# Patient Record
Sex: Male | Born: 1995 | Race: White | Hispanic: No | Marital: Single | Smoking: Current every day smoker
Health system: Southern US, Community
[De-identification: ages and names within clinical notes are randomized; demographics above are authoritative.]

## PROBLEM LIST (undated history)

## (undated) DIAGNOSIS — E119 Type 2 diabetes mellitus without complications: Secondary | ICD-10-CM

## (undated) DIAGNOSIS — I10 Essential (primary) hypertension: Secondary | ICD-10-CM

## (undated) HISTORY — PX: OTHER SURGICAL HISTORY: SHX169

---

## 2021-06-30 ENCOUNTER — Emergency Department
Admission: EM | Admit: 2021-06-30 | Discharge: 2021-06-30 | Disposition: A | Discharge status: Home / Self Care | Payer: No Typology Code available for payment source | Attending: Student in an Organized Health Care Education/Training Program | Admitting: Student in an Organized Health Care Education/Training Program

## 2021-06-30 ENCOUNTER — Other Ambulatory Visit: Payer: Self-pay

## 2021-06-30 ENCOUNTER — Ambulatory Visit: Payer: Self-pay

## 2021-06-30 ENCOUNTER — Encounter: Payer: Self-pay | Admitting: Emergency Medicine

## 2021-06-30 ENCOUNTER — Emergency Department: Payer: No Typology Code available for payment source

## 2021-06-30 DIAGNOSIS — M542 Cervicalgia: Secondary | ICD-10-CM | POA: Insufficient documentation

## 2021-06-30 DIAGNOSIS — I10 Essential (primary) hypertension: Secondary | ICD-10-CM | POA: Diagnosis not present

## 2021-06-30 DIAGNOSIS — E119 Type 2 diabetes mellitus without complications: Secondary | ICD-10-CM | POA: Diagnosis not present

## 2021-06-30 DIAGNOSIS — S060X0A Concussion without loss of consciousness, initial encounter: Secondary | ICD-10-CM | POA: Diagnosis not present

## 2021-06-30 DIAGNOSIS — Y92410 Unspecified street and highway as the place of occurrence of the external cause: Secondary | ICD-10-CM | POA: Diagnosis not present

## 2021-06-30 DIAGNOSIS — S0990XA Unspecified injury of head, initial encounter: Secondary | ICD-10-CM | POA: Diagnosis present

## 2021-06-30 HISTORY — DX: Essential (primary) hypertension: I10

## 2021-06-30 HISTORY — DX: Type 2 diabetes mellitus without complications: E11.9

## 2021-06-30 MED ORDER — ACETAMINOPHEN 500 MG PO TABS
1000.0000 mg | ORAL_TABLET | Freq: Once | ORAL | Status: AC
  Administered 2021-06-30: 1000 mg via ORAL
  Filled 2021-06-30: qty 2

## 2021-06-30 MED ORDER — METHOCARBAMOL 500 MG PO TABS: 500.0000 mg | ORAL_TABLET | Freq: Four times a day (QID) | ORAL | 0 refills | Status: AC

## 2021-06-30 MED ORDER — METHOCARBAMOL 500 MG PO TABS
500.0000 mg | ORAL_TABLET | Freq: Once | ORAL | Status: AC
  Administered 2021-06-30: 500 mg via ORAL
  Filled 2021-06-30: qty 1

## 2021-06-30 MED ORDER — IBUPROFEN 600 MG PO TABS
600.0000 mg | ORAL_TABLET | Freq: Once | ORAL | Status: AC
  Administered 2021-06-30: 600 mg via ORAL
  Filled 2021-06-30: qty 1
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
## 2021-06-30 NOTE — Discharge Instructions (Addendum)
Please treat your concussion by resting, avoiding triggers for your headaches. You may take Tylenol, up to 1000mg  4x daily as well as Ibuprofen up to 600mg  daily for your headache and neck pain. You have also been prescribed Robaxin, a muscle relaxant to take for your neck up to 4x daily as needed. Please do not drive or operate heavy machinery with this medication, as it possibly could make you drowsy. Return to the ER with any worsening symptoms, otherwise follow up with primary care or urgent care as needed.
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
## 2023-11-09 ENCOUNTER — Other Ambulatory Visit: Payer: Self-pay

## 2023-11-09 ENCOUNTER — Encounter
Admission: EM | Disposition: A | Discharge status: Other Acute Inpatient Hospital | Payer: Self-pay | Source: Home / Self Care | Attending: Family Medicine

## 2023-11-09 ENCOUNTER — Encounter: Payer: Self-pay | Admitting: Emergency Medicine

## 2023-11-09 ENCOUNTER — Emergency Department: Payer: Medicaid Other

## 2023-11-09 ENCOUNTER — Inpatient Hospital Stay: Payer: Medicaid Other | Admitting: Certified Registered"

## 2023-11-09 ENCOUNTER — Inpatient Hospital Stay
Admission: EM | Admit: 2023-11-09 | Discharge: 2023-11-10 | DRG: 854 | Disposition: A | Discharge status: Other Acute Inpatient Hospital | Payer: Medicaid Other | Attending: Internal Medicine | Admitting: Internal Medicine

## 2023-11-09 ENCOUNTER — Inpatient Hospital Stay: Payer: Medicaid Other

## 2023-11-09 DIAGNOSIS — F1290 Cannabis use, unspecified, uncomplicated: Secondary | ICD-10-CM | POA: Diagnosis present

## 2023-11-09 DIAGNOSIS — T383X6A Underdosing of insulin and oral hypoglycemic [antidiabetic] drugs, initial encounter: Secondary | ICD-10-CM | POA: Diagnosis present

## 2023-11-09 DIAGNOSIS — N493 Fournier gangrene: Secondary | ICD-10-CM | POA: Diagnosis present

## 2023-11-09 DIAGNOSIS — E1152 Type 2 diabetes mellitus with diabetic peripheral angiopathy with gangrene: Secondary | ICD-10-CM | POA: Diagnosis present

## 2023-11-09 DIAGNOSIS — R4189 Other symptoms and signs involving cognitive functions and awareness: Secondary | ICD-10-CM | POA: Diagnosis present

## 2023-11-09 DIAGNOSIS — I10 Essential (primary) hypertension: Secondary | ICD-10-CM | POA: Diagnosis present

## 2023-11-09 DIAGNOSIS — E1165 Type 2 diabetes mellitus with hyperglycemia: Secondary | ICD-10-CM | POA: Diagnosis present

## 2023-11-09 DIAGNOSIS — R739 Hyperglycemia, unspecified: Secondary | ICD-10-CM

## 2023-11-09 DIAGNOSIS — E871 Hypo-osmolality and hyponatremia: Secondary | ICD-10-CM | POA: Diagnosis present

## 2023-11-09 DIAGNOSIS — F17210 Nicotine dependence, cigarettes, uncomplicated: Secondary | ICD-10-CM | POA: Diagnosis present

## 2023-11-09 DIAGNOSIS — Z91199 Patient's noncompliance with other medical treatment and regimen due to unspecified reason: Secondary | ICD-10-CM

## 2023-11-09 DIAGNOSIS — A419 Sepsis, unspecified organism: Principal | ICD-10-CM | POA: Diagnosis present

## 2023-11-09 DIAGNOSIS — Z720 Tobacco use: Secondary | ICD-10-CM | POA: Insufficient documentation

## 2023-11-09 DIAGNOSIS — Z716 Tobacco abuse counseling: Secondary | ICD-10-CM

## 2023-11-09 DIAGNOSIS — Z91148 Patient's other noncompliance with medication regimen for other reason: Secondary | ICD-10-CM | POA: Diagnosis not present

## 2023-11-09 HISTORY — PX: SCROTAL EXPLORATION: SHX2386

## 2023-11-09 HISTORY — PX: WOUND DEBRIDEMENT: SHX247

## 2023-11-09 LAB — URINALYSIS, ROUTINE W REFLEX MICROSCOPIC
Bilirubin Urine: NEGATIVE
Glucose, UA: >=500 mg/dL — AB
Ketones, ur: 20 mg/dL — AB
Leukocytes,Ua: NEGATIVE
Nitrite: NEGATIVE
Protein, ur: 30 mg/dL — AB
Specific Gravity, Urine: 1.03 (ref 1.005–1.030)
Squamous Epithelial / HPF: 0 /[HPF] (ref 0–5)
pH: 5 (ref 5.0–8.0)

## 2023-11-09 LAB — COMPREHENSIVE METABOLIC PANEL
ALT: 28 U/L (ref 0–44)
AST: 19 U/L (ref 15–41)
Albumin: 3.8 g/dL (ref 3.5–5.0)
Alkaline Phosphatase: 101 U/L (ref 38–126)
Anion gap: 14 (ref 5–15)
BUN: 18 mg/dL (ref 6–20)
CO2: 23 mmol/L (ref 22–32)
Calcium: 9 mg/dL (ref 8.9–10.3)
Chloride: 90 mmol/L — ABNORMAL LOW (ref 98–111)
Creatinine, Ser: 1.05 mg/dL (ref 0.61–1.24)
GFR, Estimated: >60 mL/min (ref >60)
Glucose, Bld: 434 mg/dL — ABNORMAL HIGH (ref 70–99)
Potassium: 4 mmol/L (ref 3.5–5.1)
Sodium: 127 mmol/L — ABNORMAL LOW (ref 135–145)
Total Bilirubin: 1.7 mg/dL — ABNORMAL HIGH (ref <1.2)
Total Protein: 8.1 g/dL (ref 6.5–8.1)

## 2023-11-09 LAB — CBC WITH DIFFERENTIAL/PLATELET
Abs Immature Granulocytes: 0.26 10*3/uL — ABNORMAL HIGH (ref 0.00–0.07)
Basophils Absolute: 0.1 10*3/uL (ref 0.0–0.1)
Basophils Relative: 0 %
Eosinophils Absolute: 0 10*3/uL (ref 0.0–0.5)
Eosinophils Relative: 0 %
HCT: 40.2 % (ref 39.0–52.0)
Hemoglobin: 14 g/dL (ref 13.0–17.0)
Immature Granulocytes: 1 %
Lymphocytes Relative: 9 %
Lymphs Abs: 1.8 10*3/uL (ref 0.7–4.0)
MCH: 28.3 pg (ref 26.0–34.0)
MCHC: 34.8 g/dL (ref 30.0–36.0)
MCV: 81.2 fL (ref 80.0–100.0)
Monocytes Absolute: 1 10*3/uL (ref 0.1–1.0)
Monocytes Relative: 5 %
Neutro Abs: 18.3 10*3/uL — ABNORMAL HIGH (ref 1.7–7.7)
Neutrophils Relative %: 85 %
Platelets: 279 10*3/uL (ref 150–400)
RBC: 4.95 MIL/uL (ref 4.22–5.81)
RDW: 11.8 % (ref 11.5–15.5)
WBC: 21.4 10*3/uL — ABNORMAL HIGH (ref 4.0–10.5)
nRBC: 0 % (ref 0.0–0.2)

## 2023-11-09 LAB — CBG MONITORING, ED
Glucose-Capillary: 270 mg/dL — ABNORMAL HIGH (ref 70–99)
Glucose-Capillary: 222 mg/dL — ABNORMAL HIGH (ref 70–99)
Glucose-Capillary: 220 mg/dL — ABNORMAL HIGH (ref 70–99)

## 2023-11-09 LAB — CBC
HCT: 30.7 % — ABNORMAL LOW (ref 39.0–52.0)
Hemoglobin: 10.6 g/dL — ABNORMAL LOW (ref 13.0–17.0)
MCH: 28.6 pg (ref 26.0–34.0)
MCHC: 34.5 g/dL (ref 30.0–36.0)
MCV: 82.7 fL (ref 80.0–100.0)
Platelets: 199 10*3/uL (ref 150–400)
RBC: 3.71 MIL/uL — ABNORMAL LOW (ref 4.22–5.81)
RDW: 12.1 % (ref 11.5–15.5)
WBC: 14.7 10*3/uL — ABNORMAL HIGH (ref 4.0–10.5)
nRBC: 0 % (ref 0.0–0.2)

## 2023-11-09 LAB — CHLAMYDIA/NGC RT PCR (ARMC ONLY)
Chlamydia Tr: NOT DETECTED
N gonorrhoeae: NOT DETECTED

## 2023-11-09 LAB — CREATININE, SERUM
Creatinine, Ser: 0.84 mg/dL (ref 0.61–1.24)
GFR, Estimated: >60 mL/min (ref >60)

## 2023-11-09 LAB — LACTIC ACID, PLASMA
Lactic Acid, Venous: 3.1 mmol/L (ref 0.5–1.9)
Lactic Acid, Venous: 1.8 mmol/L (ref 0.5–1.9)

## 2023-11-09 LAB — GLUCOSE, CAPILLARY: Glucose-Capillary: 247 mg/dL — ABNORMAL HIGH (ref 70–99)

## 2023-11-09 LAB — BETA-HYDROXYBUTYRIC ACID: Beta-Hydroxybutyric Acid: 0.45 mmol/L — ABNORMAL HIGH (ref 0.05–0.27)

## 2023-11-09 SURGERY — EXPLORATION, SCROTUM
Anesthesia: General | Site: Scrotum | Laterality: Bilateral

## 2023-11-09 MED ORDER — SUGAMMADEX SODIUM 200 MG/2ML IV SOLN
INTRAVENOUS | Status: DC | PRN
  Administered 2023-11-09: 200 mg via INTRAVENOUS

## 2023-11-09 MED ORDER — ENOXAPARIN SODIUM 40 MG/0.4ML IJ SOSY
40.0000 mg | PREFILLED_SYRINGE | INTRAMUSCULAR | Status: DC
  Administered 2023-11-09: 40 mg via SUBCUTANEOUS
  Filled 2023-11-09: qty 0.4

## 2023-11-09 MED ORDER — SODIUM CHLORIDE 0.9 % IV SOLN: INTRAVENOUS | Status: DC | PRN

## 2023-11-09 MED ORDER — MIDAZOLAM HCL 2 MG/2ML IJ SOLN
INTRAMUSCULAR | Status: AC
  Filled 2023-11-09: qty 2

## 2023-11-09 MED ORDER — SODIUM CHLORIDE 0.9 % IV SOLN
2.0000 g | Freq: Once | INTRAVENOUS | Status: AC
Start: 2023-11-09 — End: 2023-11-09
  Administered 2023-11-09: 2 g via INTRAVENOUS
  Filled 2023-11-09: qty 20

## 2023-11-09 MED ORDER — CLINDAMYCIN PHOSPHATE 600 MG/50ML IV SOLN
600.0000 mg | Freq: Three times a day (TID) | INTRAVENOUS | Status: DC
  Filled 2023-11-09: qty 50

## 2023-11-09 MED ORDER — LIDOCAINE HCL (CARDIAC) PF 100 MG/5ML IV SOSY
PREFILLED_SYRINGE | INTRAVENOUS | Status: DC | PRN
  Administered 2023-11-09: 100 mg via INTRAVENOUS

## 2023-11-09 MED ORDER — FENTANYL CITRATE (PF) 100 MCG/2ML IJ SOLN: 25.0000 ug | INTRAMUSCULAR | Status: DC | PRN

## 2023-11-09 MED ORDER — ONDANSETRON HCL 4 MG/2ML IJ SOLN: 4.0000 mg | Freq: Once | INTRAMUSCULAR | Status: DC | PRN

## 2023-11-09 MED ORDER — ONDANSETRON HCL 4 MG/2ML IJ SOLN
4.0000 mg | Freq: Four times a day (QID) | INTRAMUSCULAR | Status: DC | PRN
  Administered 2023-11-10 (×2): 4 mg via INTRAVENOUS
  Filled 2023-11-09 (×2): qty 2

## 2023-11-09 MED ORDER — LINEZOLID 600 MG/300ML IV SOLN
600.0000 mg | Freq: Two times a day (BID) | INTRAVENOUS | Status: DC
  Administered 2023-11-09 – 2023-11-10 (×3): 600 mg via INTRAVENOUS
  Filled 2023-11-09 (×4): qty 300

## 2023-11-09 MED ORDER — IOHEXOL 300 MG/ML  SOLN
100.0000 mL | Freq: Once | INTRAMUSCULAR | Status: AC | PRN
Start: 2023-11-09 — End: 2023-11-09
  Administered 2023-11-09: 100 mL via INTRAVENOUS

## 2023-11-09 MED ORDER — ACETAMINOPHEN 10 MG/ML IV SOLN
1000.0000 mg | Freq: Once | INTRAVENOUS | Status: DC | PRN
  Administered 2023-11-09: 1000 mg via INTRAVENOUS

## 2023-11-09 MED ORDER — INSULIN ASPART 100 UNIT/ML IJ SOLN
4.0000 [IU] | Freq: Three times a day (TID) | INTRAMUSCULAR | Status: DC
  Administered 2023-11-10 (×2): 4 [IU] via SUBCUTANEOUS
  Filled 2023-11-09 (×2): qty 1

## 2023-11-09 MED ORDER — INSULIN ASPART 100 UNIT/ML IJ SOLN
0.0000 [IU] | Freq: Every day | INTRAMUSCULAR | Status: DC
  Administered 2023-11-09: 2 [IU] via SUBCUTANEOUS
  Filled 2023-11-09: qty 1

## 2023-11-09 MED ORDER — SODIUM CHLORIDE 0.9 % IV BOLUS
1000.0000 mL | Freq: Once | INTRAVENOUS | Status: AC
Start: 2023-11-09 — End: 2023-11-09
  Administered 2023-11-09: 1000 mL via INTRAVENOUS

## 2023-11-09 MED ORDER — ROCURONIUM BROMIDE 100 MG/10ML IV SOLN
INTRAVENOUS | Status: DC | PRN
  Administered 2023-11-09: 20 mg via INTRAVENOUS
  Administered 2023-11-09: 10 mg via INTRAVENOUS
  Administered 2023-11-09: 20 mg via INTRAVENOUS

## 2023-11-09 MED ORDER — PROPOFOL 10 MG/ML IV BOLUS
INTRAVENOUS | Status: DC | PRN
  Administered 2023-11-09: 150 mg via INTRAVENOUS

## 2023-11-09 MED ORDER — LACTATED RINGERS IV SOLN: INTRAVENOUS | Status: DC

## 2023-11-09 MED ORDER — ACETAMINOPHEN 10 MG/ML IV SOLN
INTRAVENOUS | Status: AC
  Filled 2023-11-09: qty 100

## 2023-11-09 MED ORDER — 0.9 % SODIUM CHLORIDE (POUR BTL) OPTIME
TOPICAL | Status: DC | PRN
  Administered 2023-11-09: 500 mL

## 2023-11-09 MED ORDER — INSULIN ASPART 100 UNIT/ML IJ SOLN
0.0000 [IU] | Freq: Three times a day (TID) | INTRAMUSCULAR | Status: DC
  Administered 2023-11-10: 3 [IU] via SUBCUTANEOUS
  Administered 2023-11-10: 11 [IU] via SUBCUTANEOUS
  Filled 2023-11-09 (×2): qty 1

## 2023-11-09 MED ORDER — ONDANSETRON HCL 4 MG/2ML IJ SOLN
INTRAMUSCULAR | Status: DC | PRN
  Administered 2023-11-09 (×2): 4 mg via INTRAVENOUS

## 2023-11-09 MED ORDER — PIPERACILLIN-TAZOBACTAM 3.375 G IVPB
3.3750 g | Freq: Three times a day (TID) | INTRAVENOUS | Status: DC
  Administered 2023-11-09 – 2023-11-10 (×4): 3.375 g via INTRAVENOUS
  Filled 2023-11-09 (×3): qty 50

## 2023-11-09 MED ORDER — OXYCODONE HCL 5 MG PO TABS
5.0000 mg | ORAL_TABLET | Freq: Once | ORAL | Status: AC | PRN
  Administered 2023-11-09: 5 mg via ORAL

## 2023-11-09 MED ORDER — MIDAZOLAM HCL 2 MG/2ML IJ SOLN
INTRAMUSCULAR | Status: DC | PRN
  Administered 2023-11-09: 2 mg via INTRAVENOUS

## 2023-11-09 MED ORDER — FENTANYL CITRATE (PF) 100 MCG/2ML IJ SOLN
INTRAMUSCULAR | Status: DC | PRN
  Administered 2023-11-09 (×2): 50 ug via INTRAVENOUS
  Administered 2023-11-09: 100 ug via INTRAVENOUS

## 2023-11-09 MED ORDER — PROPOFOL 10 MG/ML IV BOLUS
INTRAVENOUS | Status: AC
  Filled 2023-11-09: qty 20

## 2023-11-09 MED ORDER — FENTANYL CITRATE (PF) 100 MCG/2ML IJ SOLN
INTRAMUSCULAR | Status: AC
  Filled 2023-11-09: qty 2

## 2023-11-09 MED ORDER — IRRISEPT - 450ML BOTTLE WITH 0.05% CHG IN STERILE WATER, USP 99.95% OPTIME
TOPICAL | Status: DC | PRN
  Administered 2023-11-09: 450 mL

## 2023-11-09 MED ORDER — DEXMEDETOMIDINE HCL IN NACL 80 MCG/20ML IV SOLN
INTRAVENOUS | Status: DC | PRN
  Administered 2023-11-09 (×3): 4 ug via INTRAVENOUS
  Administered 2023-11-09: 8 ug via INTRAVENOUS

## 2023-11-09 MED ORDER — HYDROCODONE-ACETAMINOPHEN 5-325 MG PO TABS
1.0000 | ORAL_TABLET | Freq: Once | ORAL | Status: AC
  Administered 2023-11-09: 1 via ORAL
  Filled 2023-11-09: qty 1

## 2023-11-09 MED ORDER — INSULIN ASPART 100 UNIT/ML IJ SOLN
10.0000 [IU] | Freq: Once | INTRAMUSCULAR | Status: AC
Start: 2023-11-09 — End: 2023-11-09
  Administered 2023-11-09: 10 [IU] via INTRAVENOUS
  Filled 2023-11-09: qty 1

## 2023-11-09 MED ORDER — SUCCINYLCHOLINE CHLORIDE 200 MG/10ML IV SOSY
PREFILLED_SYRINGE | INTRAVENOUS | Status: DC | PRN
  Administered 2023-11-09: 100 mg via INTRAVENOUS

## 2023-11-09 MED ORDER — MORPHINE SULFATE (PF) 4 MG/ML IV SOLN
4.0000 mg | INTRAVENOUS | Status: DC | PRN
Start: 2023-11-09 — End: 2023-11-10
  Administered 2023-11-10: 4 mg via INTRAVENOUS
  Filled 2023-11-09 (×2): qty 1

## 2023-11-09 MED ORDER — INSULIN ASPART 100 UNIT/ML IJ SOLN
5.0000 [IU] | Freq: Once | INTRAMUSCULAR | Status: AC
  Administered 2023-11-09: 5 [IU] via SUBCUTANEOUS

## 2023-11-09 MED ORDER — INSULIN ASPART 100 UNIT/ML IJ SOLN
INTRAMUSCULAR | Status: AC
  Filled 2023-11-09: qty 1

## 2023-11-09 MED ORDER — SUCCINYLCHOLINE CHLORIDE 200 MG/10ML IV SOSY
PREFILLED_SYRINGE | INTRAVENOUS | Status: AC
  Filled 2023-11-09: qty 10

## 2023-11-09 MED ORDER — VANCOMYCIN HCL IN DEXTROSE 1-5 GM/200ML-% IV SOLN
1000.0000 mg | Freq: Once | INTRAVENOUS | Status: AC
Start: 2023-11-09 — End: 2023-11-09
  Administered 2023-11-09: 1000 mg via INTRAVENOUS
  Filled 2023-11-09: qty 200

## 2023-11-09 MED ORDER — LIDOCAINE HCL (PF) 2 % IJ SOLN
INTRAMUSCULAR | Status: AC
  Filled 2023-11-09: qty 5

## 2023-11-09 MED ORDER — SODIUM CHLORIDE 0.9 % IV BOLUS (SEPSIS)
1000.0000 mL | Freq: Once | INTRAVENOUS | Status: AC
  Administered 2023-11-09: 1000 mL via INTRAVENOUS

## 2023-11-09 MED ORDER — ONDANSETRON HCL 4 MG/2ML IJ SOLN
4.0000 mg | Freq: Once | INTRAMUSCULAR | Status: AC
  Administered 2023-11-09: 4 mg via INTRAVENOUS
  Filled 2023-11-09: qty 2

## 2023-11-09 MED ORDER — OXYCODONE HCL 5 MG/5ML PO SOLN: 5.0000 mg | Freq: Once | ORAL | Status: AC | PRN

## 2023-11-09 MED ORDER — ONDANSETRON HCL 4 MG PO TABS: 4.0000 mg | ORAL_TABLET | Freq: Four times a day (QID) | ORAL | Status: DC | PRN

## 2023-11-09 MED ORDER — KETOROLAC TROMETHAMINE 30 MG/ML IJ SOLN
INTRAMUSCULAR | Status: DC | PRN
  Administered 2023-11-09: 30 mg via INTRAVENOUS

## 2023-11-09 MED ORDER — OXYCODONE HCL 5 MG PO TABS
ORAL_TABLET | ORAL | Status: AC
  Filled 2023-11-09: qty 1

## 2023-11-09 MED ORDER — PIPERACILLIN-TAZOBACTAM 3.375 G IVPB
INTRAVENOUS | Status: AC
  Filled 2023-11-09: qty 50

## 2023-11-09 MED ORDER — SODIUM CHLORIDE 0.9 % IV BOLUS (SEPSIS)
1000.0000 mL | Freq: Once | INTRAVENOUS | Status: AC
Start: 2023-11-09 — End: 2023-11-09
  Administered 2023-11-09: 1000 mL via INTRAVENOUS

## 2023-11-09 MED ORDER — NICOTINE 21 MG/24HR TD PT24
21.0000 mg | MEDICATED_PATCH | Freq: Every day | TRANSDERMAL | Status: DC
  Filled 2023-11-09: qty 1

## 2023-11-09 SURGICAL SUPPLY — 37 items
BLADE SURG 15 STRL LF DISP TIS (BLADE) ×2 IMPLANT
BNDG GAUZE DERMACEA FLUFF 4 (GAUZE/BANDAGES/DRESSINGS) IMPLANT
CNTNR URN SCR LID CUP LEK RST (MISCELLANEOUS) IMPLANT
DRAIN PENROSE 12X.25 LTX STRL (MISCELLANEOUS) ×2 IMPLANT
DRAPE LAPAROTOMY 100X77 ABD (DRAPES) ×2 IMPLANT
DRSG GAUZE FLUFF 36X18 (GAUZE/BANDAGES/DRESSINGS) ×2 IMPLANT
DRSG TEGADERM 4X4.75 (GAUZE/BANDAGES/DRESSINGS) ×2 IMPLANT
DRSG TELFA 3X4 N-ADH STERILE (GAUZE/BANDAGES/DRESSINGS) ×2 IMPLANT
ELECT REM PT RETURN 9FT ADLT (ELECTROSURGICAL) ×2
ELECTRODE REM PT RTRN 9FT ADLT (ELECTROSURGICAL) ×2 IMPLANT
GAUZE 4X4 16PLY ~~LOC~~+RFID DBL (SPONGE) ×2 IMPLANT
GLOVE BIO SURGEON STRL SZ 6.5 (GLOVE) ×2 IMPLANT
GLOVE INDICATOR 7.0 STRL GRN (GLOVE) ×2 IMPLANT
GOWN STRL REUS W/ TWL LRG LVL3 (GOWN DISPOSABLE) ×4 IMPLANT
JET LAVAGE IRRISEPT WOUND (IRRIGATION / IRRIGATOR) ×2
KIT TURNOVER KIT A (KITS) ×2 IMPLANT
LABEL OR SOLS (LABEL) ×2 IMPLANT
LAVAGE JET IRRISEPT WOUND (IRRIGATION / IRRIGATOR) IMPLANT
MANIFOLD NEPTUNE II (INSTRUMENTS) ×2 IMPLANT
NS IRRIG 500ML POUR BTL (IV SOLUTION) ×2 IMPLANT
PACK BASIN MINOR ARMC (MISCELLANEOUS) ×2 IMPLANT
PAD ABD DERMACEA PRESS 5X9 (GAUZE/BANDAGES/DRESSINGS) IMPLANT
SOL PREP PVP 2OZ (MISCELLANEOUS) ×2
SOLUTION PREP PVP 2OZ (MISCELLANEOUS) ×2 IMPLANT
SPONGE KITTNER 5P (MISCELLANEOUS) ×2 IMPLANT
SPONGE T-LAP 18X18 ~~LOC~~+RFID (SPONGE) IMPLANT
STRIP CLOSURE SKIN 1/2X4 (GAUZE/BANDAGES/DRESSINGS) ×2 IMPLANT
SUPPORT SCROTAL LRG NO STRP (SOFTGOODS) ×2 IMPLANT
SUT CHROMIC 3 0 SH 27 (SUTURE) ×2 IMPLANT
SUT CHROMIC 4 0 SH 27 (SUTURE) ×2 IMPLANT
SUT CHROMIC BR 1/2CLE 2-0 54IN (SUTURE) ×2 IMPLANT
SUT PLAIN 3 0 SH 27IN (SUTURE) ×2 IMPLANT
SUT SILK 2 0 SH (SUTURE) ×2 IMPLANT
SUT VIC AB 4-0 PS2 18 (SUTURE) ×2 IMPLANT
TRAP FLUID SMOKE EVACUATOR (MISCELLANEOUS) ×2 IMPLANT
TRAY FOLEY SLVR 16FR LF STAT (SET/KITS/TRAYS/PACK) IMPLANT
WATER STERILE IRR 500ML POUR (IV SOLUTION) ×2 IMPLANT

## 2023-11-09 NOTE — Op Note (Addendum)
Date of procedure: 11/09/23  Preoperative diagnosis:  Fournier's gangrene  Postoperative diagnosis:  Same as above  Procedure: Scrotal exploration with surgical debridement Partial perineal debridement  Surgeon: Vanna Scotland, MD  Anesthesia: General  Complications: None  Intraoperative findings: See pictures below.  Extensive necrotic tissue involving primarily the left hemiscrotum, base of scrotum and entirety of the perineum.  General surgery consulted intraoperatively due to the extent of the perineal involvement.  Final wound size approximately 19 cm x 12 cm.  EBL: 20 cc  Specimens: Necrotic scrotal/perineal tissue for wound culture and pathology  Drains: 16 French Foley catheter  Indication: Grant Curry is a 27 y.o. patient with uncontrolled diabetes presenting with clinical Fournier's gangrene.  A stat CT was ordered for surgical planning purposes and he was taken to the operating room shortly thereafter.  After reviewing the management options for treatment, he elected to proceed with the above surgical procedure(s). We have discussed the potential benefits and risks of the procedure, side effects of the proposed treatment, the likelihood of the patient achieving the goals of the procedure, and any potential problems that might occur during the procedure or recuperation. Informed consent has been obtained.  Consent was also obtained from his parents who help him in his decision making.  They were both present at the bedside in the preop holding area.  Description of procedure:  The patient was taken to the operating room and general anesthesia was induced.  The patient was placed in the dorsal lithotomy position, prepped and draped in the usual sterile fashion, and preoperative antibiotics were administered. A preoperative time-out was performed.   A 16 French Foley catheter was placed sterilely on the field.  This was to help with urinary drainage but as well as urethral  identification.  There was a expanding Clydene Pugh which appeared to have progressed since in the ER measuring at least 3 x 3 cm.  The initially made an incision circumferentially around the eschar and upon doing so, it became clear that this was a fairly extensive and very necrotic tissue deep to this layer.  The tissue was gray, gelatinous, extremely malodorous with seeping dirty dishwater quality fluid.  I ended up extending the incision vertically and off to the left opening the scrotal tissues and debriding along the way.  Ultimately, necrotic tissue was identified laterally to the left almost to the level of the superior scrotum adjacent to the base of the penis.  It involved sheets of necrotic dark toes tissue all of which was excised.  Ended up having to excise a fair amount of skin to aid in exposure.  I dissected the left side all the way down to the tunica vaginalis of the left testicle which did not overtly appear to be involved.  I bluntly dissected around this testicle to break up any other tissue which appeared necrotic until the extent of the debridement appeared to be satisfactory.  Then extended the incision down into the perineum and this tissue was surprisingly just is necrotic and more involved than anticipated based on CT scan and exam.  I ended up excising a large strip of tissue about two thirds way down the perineum out laterally towards the inguinal folds.  A day progressed inferiorly, called Dr. Tonna Boehringer to come assist to help to further debride the perineum and assess the perirectal area.  Please see his notes for further details.  With all the debridement was completed, a large defect remained.  The wound bed appeared to be  mostly viable and the skin edges were healthy and bleeding.  Careful and adequate hemostasis was achieved.  Dimensions and pictures of the wound as above and below.  The wound was copiously irrigated using diluted chlorhexidine solution.  Finally, Curlex and ABD pads along  with mesh panties were applied.  The patient was then awakened and transferred to the PACU in stable condition.  Plan: Continue supportive care and broad-spectrum antibiotics.  Follow-up wound culture.  Lengthy discussion with the patient's family today about further debridement plans as well as involvement of plastics down the road.  We discussed the overall course for this condition as well as its very serious nature.  Will plan to return tomorrow around noon for dressing change under anesthesia and possible further debridement as deemed necessary.  N.p.o. at midnight.  Vanna Scotland, M.D.

## 2023-11-09 NOTE — H&P (Signed)
History and Physical    Patient: Grant Curry ZOX:[id number] DOB: [birth year] DOA: 11/09/2023 DOS: the patient was seen and examined on 11/09/2023 PCP: Pcp, No  Patient coming from: Home  Chief Complaint:  Chief Complaint  Patient presents with   Groin Pain   HPI: Grant Curry is a 27 y.o. male with medical history significant of tobacco abuse, uncontrolled type 2 diabetes, hypertension presenting with sepsis, Fournier's gangrene, hyperglycemia.  Patient reports worsening left groin pain over the past week or so.  Positive generalized malaise and chills.  No chest pain or shortness of breath.  Baseline type 2 diabetes.  Does not take medications or check sugar on a regular basis.  Also 1 pack/day smoker.  Denies any prior episodes like this in the past.  Positive lower abdominal pain.  No focal hemiparesis or confusion.  Baseline mild cognitive deficit. Presented to the ER afebrile, heart rate 100s, BP stable.  Satting well on room air.  White count 21.4, hemoglobin 14, platelets 279, creatinine 1.05, glucose 434, CO2 23.  T. bili 1.7.  Lactate 3.1-1.8.  Scrotal ultrasound with diffuse asymmetric thickening along left scrotal wall.  CT abdomen pelvis with contrast pending. Review of Systems: As mentioned in the history of present illness. All other systems reviewed and are negative. Past Medical History:  Diagnosis Date   Diabetes mellitus without complication (HCC)    Hypertension    History reviewed. No pertinent surgical history. Social History:  reports that he has never smoked. He has never used smokeless tobacco. He reports that he does not drink alcohol and does not use drugs.  No Known Allergies  History reviewed. No pertinent family history.  Prior to Admission medications   Not on File    Physical Exam: Vitals:   11/09/23 1227 11/09/23 1230 11/09/23 1300 11/09/23 1424  BP: (!) 141/87 (!) 142/86 135/80 135/80  Pulse:  96 98 98  Resp:    16  Temp:    98.8 F (37.1 C)   TempSrc:    Oral  SpO2:    97%  Weight:      Height:       Physical Exam Constitutional:      Appearance: He is normal weight.  HENT:     Head: Normocephalic and atraumatic.     Nose: Nose normal.     Mouth/Throat:     Mouth: Mucous membranes are moist.  Eyes:     Pupils: Pupils are equal, round, and reactive to light.  Cardiovascular:     Rate and Rhythm: Normal rate and regular rhythm.  Pulmonary:     Effort: Pulmonary effort is normal.  Abdominal:     General: Bowel sounds are normal.  Genitourinary:    Comments: See picture  Musculoskeletal:        General: Normal range of motion.  Skin:    Comments: See picture    Neurological:     General: No focal deficit present.     Data Reviewed:  There are no new results to review at this time.  US SCROTUM W/DOPPLER CLINICAL DATA:  [id number] Swelling of left half of scrotum [id number]  EXAM: SCROTAL ULTRASOUND  DOPPLER ULTRASOUND OF THE TESTICLES  TECHNIQUE: Complete ultrasound examination of the testicles, epididymis, and other scrotal structures was performed. Color and spectral Doppler ultrasound were also utilized to evaluate blood flow to the testicles.  COMPARISON:  None Available.  FINDINGS: Right testicle  Measurements: 2.0 x 2.9 x 4.8 cm. No mass or microlithiasis visualized.  Left testicle  Measurements: 2.0 x 2.8 x 4.4 cm. No mass or microlithiasis visualized.  Right epididymis:  Normal in size and appearance.  Left epididymis:  Normal in size and appearance.  Hydrocele:  None visualized.  Varicocele:  None visualized.  Pulsed Doppler interrogation of both testes demonstrates normal low resistance arterial and venous waveforms bilaterally.  There is diffuse asymmetric thickening along the left scrotal wall, as seen on the provided images. There are heterogeneous hypoechoic areas; however, no discrete drainable abscess or collection.  IMPRESSION: *There is diffuse asymmetric thickening  along the left scrotal wall, as seen on the provided images. There are heterogeneous hypoechoic areas; however, no discrete drainable abscess or collection. Findings may represent subcutaneous edema. Correlate clinically. *Otherwise unremarkable exam.  Electronically Signed   By: Jules Schick M.D.   On: 11/09/2023 15:21  Lab Results  Component Value Date   WBC 21.4 (H) 11/09/2023   HGB 14.0 11/09/2023   HCT 40.2 11/09/2023   MCV 81.2 11/09/2023   PLT 279 11/09/2023   Last metabolic panel Lab Results  Component Value Date   GLUCOSE 434 (H) 11/09/2023   NA 127 (L) 11/09/2023   K 4.0 11/09/2023   CL 90 (L) 11/09/2023   CO2 23 11/09/2023   BUN 18 11/09/2023   CREATININE 1.05 11/09/2023   GFRNONAA >60 11/09/2023   CALCIUM 9.0 11/09/2023   PROT 8.1 11/09/2023   ALBUMIN 3.8 11/09/2023   BILITOT 1.7 (H) 11/09/2023   ALKPHOS 101 11/09/2023   AST 19 11/09/2023   ALT 28 11/09/2023   ANIONGAP 14 11/09/2023    Assessment and Plan: * Fournier gangrene Marked L sided scrotal redness, swelling and pain  RFs include poorly controlled DM, tobacco abuse  Dr. Apolinar Junes w/ urology evaluated patient at the bedside- plan for operative evaluation today  Pending CT A&P to better assess  IV zosyn, clindamycin and vancomycin for infectious coverage per urology recommendations  Pan culture  Follow    Sepsis [facility]) Meeting sepsis criteria w/ HR 100s, WBC 21  Lactate 3.1  Noted genitourinary source w/ fournier's gangrene  Panculture  IV zosyn, clindamycin and vancomycin for infectious coverage per urology recommendations  LR MIVF  Trend lactate  Follow closely   Tobacco abuse 1 PPD smoker  Discussed cessation at length  Nicotine patch  Follow    Hyperglycemia Blood sugar in 400s on presentation w/o overt DKA  Baseline poorly controlled DM- not on medication  SSI  A1C  Discussed importance of appropriate blood sugar management Diabetic education  Follow        Advance  Care Planning:   Code Status: Not on file   Consults: Urology   Family Communication: No family at the bedside   Severity of Illness: The appropriate patient status for this patient is INPATIENT. Inpatient status is judged to be reasonable and necessary in order to provide the required intensity of service to ensure the patient's safety. The patient's presenting symptoms, physical exam findings, and initial radiographic and laboratory data in the context of their chronic comorbidities is felt to place them at high risk for further clinical deterioration. Furthermore, it is not anticipated that the patient will be medically stable for discharge from the hospital within 2 midnights of admission.   * I certify that at the point of admission it is my clinical judgment that the patient will require inpatient hospital care spanning beyond 2 midnights from the point of admission due to high intensity of service, high risk for  further deterioration and high frequency of surveillance required.*  Author: Floydene Flock, MD 11/09/2023 3:18 PM  For on call review [email or web address].

## 2023-11-09 NOTE — ED Notes (Signed)
Pt unaware if he has ever had a hernia, states that he "windmilled" his left leg getting out of bed before thanksgiving, states then on thanksgiving in the shower he noticed swelling to the left testicle and states that it has gotten bigger

## 2023-11-09 NOTE — Sepsis Progress Note (Signed)
eLink is following this Code Sepsis. °

## 2023-11-09 NOTE — Anesthesia Procedure Notes (Signed)
Procedure Name: Intubation Date/Time: 11/09/2023 4:43 PM  Performed by: Morene Crocker, CRNAPre-anesthesia Checklist: Patient identified, Emergency Drugs available, Suction available and Patient being monitored Patient Re-evaluated:Patient Re-evaluated prior to induction Oxygen Delivery Method: Circle system utilized Preoxygenation: Pre-oxygenation with 100% oxygen Induction Type: IV induction, Rapid sequence and Cricoid Pressure applied Laryngoscope Size: McGrath and 4 Grade View: Grade I Tube type: Oral Tube size: 7.0 mm Number of attempts: 1 Airway Equipment and Method: Stylet Placement Confirmation: ETT inserted through vocal cords under direct vision, positive ETCO2 and breath sounds checked- equal and bilateral Secured at: 21 cm Tube secured with: Tape Dental Injury: Teeth and Oropharynx as per pre-operative assessment

## 2023-11-09 NOTE — Transfer of Care (Signed)
Immediate Anesthesia Transfer of Care Note  Patient: Grant Curry  Procedure(s) Performed: Sande Brothers AND PARTIAL PERINEAL EXPLORATION AND DEBRIDEMENT FOR FOURNIER'S GANGRENE (Bilateral: Scrotum) DEBRIDEMENT PERINEAL WOUND (Scrotum)  Patient Location: PACU  Anesthesia Type:General  Level of Consciousness: drowsy  Airway & Oxygen Therapy: Patient Spontanous Breathing and Patient connected to face mask oxygen  Post-op Assessment: Report given to RN and Post -op Vital signs reviewed and stable  Post vital signs: Reviewed and stable  Last Vitals:  Vitals Value Taken Time  BP 133/70 11/09/23 1807  Temp    Pulse 107 11/09/23 1810  Resp 23 11/09/23 1810  SpO2 100 % 11/09/23 1810  Vitals shown include unfiled device data.  Last Pain:  Vitals:   11/09/23 1424  TempSrc: Oral  PainSc:          Complications: No notable events documented.

## 2023-11-09 NOTE — Assessment & Plan Note (Signed)
Marked L sided scrotal redness, swelling and pain  RFs include poorly controlled DM, tobacco abuse  Dr. Apolinar Junes w/ urology evaluated patient at the bedside- plan for operative evaluation today  Pending CT A&P to better assess  IV zosyn, clindamycin and vancomycin for infectious coverage per urology recommendations  Pan culture  Follow

## 2023-11-09 NOTE — Consult Note (Addendum)
Urology Consult  I have been asked to see the patient by Dr. Alvester Morin, for evaluation and management of Fournier's gangrene.  Chief Complaint: Scrotal pain  History of Present Illness: Grant Curry is a 27 y.o. year old diabetic male presenting to the ER for with a week of scrotal pain and swelling.  His mom reports that over Thanksgiving, he felt like he pulled something in his groin and was having some pain but no swelling.  Over the past 3 days or so, the swelling is progressed.  In the emergency room, exam was concerning for Fournier's gangrene.  He also had a significant lactate to 3.1, leukocytosis to 21.4 and markedly elevated blood sugar to 434.  He was tachycardic but improved with fluids.  He was started on broad-spectrum antibiotics in the form of ceftriaxone and vancomycin later broadened to linezolid and Zosyn after discussing with pharmacist.  Scrotal ultrasound showed scrotal wall edema.  Good blood flow in each of the testicles.  After my exam, he was sent for stat CT primarily for surgical planning purposes.  Processes appears to be confined to the scrotum, awaiting radiologic interpretation.  Patient and his family report that he was diagnosed with type 2 diabetes about 10 years ago.  Unfortunately, he refused to take metformin and has been untreated since that time.  History of scrotal pathology or abscess.  Both parents are present at bedside and aid in the decision making.  Past Medical History:  Diagnosis Date   Diabetes mellitus without complication (HCC)    Hypertension     History reviewed. No pertinent surgical history.  Home Medications:  No outpatient medications have been marked as taking for the 11/09/23 encounter [facility] Encounter).    Allergies: No Known Allergies  History reviewed. No pertinent family history.  Social History:  reports that he has never smoked. He has never used smokeless tobacco. He reports that he does not drink alcohol  and does not use drugs.  ROS: A complete review of systems was performed.  All systems are negative except for pertinent findings as noted.  Physical Exam:  Vital signs in last 24 hours: Temp:  [98.8 F (37.1 C)] 98.8 F (37.1 C) (12/04 1424) Pulse Rate:  [96-111] 98 (12/04 1424) Resp:  [16-18] 16 (12/04 1424) BP: (132-151)/(80-111) 135/80 (12/04 1424) SpO2:  [97 %-100 %] 97 % (12/04 1424) Weight:  [83.9 kg] 83.9 kg (12/04 1000) Constitutional:  Alert and oriented, No acute distress HEENT: [place] AT, moist mucus membranes.  Trachea midline, no masses Cardiovascular: Regular rate and rhythm, no clubbing, cyanosis, or edema. Respiratory: Normal respiratory effort, lungs clear bilaterally GI: Abdomen is soft, nontender, nondistended, no abdominal masses GU: Edematous slightly erythematous primarily left hemiscrotum.  The base of the scrotum, there is a black eschar with some fluctuance measuring about 2 cm x 2 cm in diameter.  The edema extends into the perineum.  There is no obvious crepitus appreciated.   No penile swelling or involvement.   Lymph: No inguinal adenopathy Neurologic: Grossly intact, no focal deficits, moving all 4 extremities Psychiatric: Normal mood and affect   Laboratory Data:  Recent Labs    11/09/23 1203  WBC 21.4*  HGB 14.0  HCT 40.2   Recent Labs    11/09/23 1203  NA 127*  K 4.0  CL 90*  CO2 23  GLUCOSE 434*  BUN 18  CREATININE 1.05  CALCIUM 9.0   No results for input(s): "LABPT", "INR" in the  last 72 hours. No results for input(s): "LABURIN" in the last 72 hours. Results for orders placed or performed during the hospital encounter of 11/09/23  Chlamydia/NGC rt PCR ([facility] only)     Status: None   Collection Time: 11/09/23 10:01 AM   Specimen: Urine; GU  Result Value Ref Range Status   Specimen source GC/Chlam URINE, RANDOM  Final   Chlamydia Tr NOT DETECTED NOT DETECTED Final   N gonorrhoeae NOT DETECTED NOT DETECTED Final    Comment:  (NOTE) This CT/NG assay has not been evaluated in patients with a history of  hysterectomy. Performed at [facility], [address] [place], [place] [address]      Radiologic Imaging: Both the CT scan and scrotal ultrasound personally reviewed.  No obvious fluid collections or gas on CT.  There is thickening of the left hemiscrotum.  Testicles appear viable on ultrasound.  No retroperitoneal or any other disease outside of the scrotum appreciated.  Impression/ Plan:  1.  Fournier's gangrene-there is necrotic/dead tissue at the base of the scrotum requiring debridement.  Stat CT scan prior to surgical intervention was personally reviewed.  Does not appear to be any involvement with the perirectal space or superior to the scrotum.  He has been started on broad-spectrum antibiotics.  In discussion with the pharmacist, recommendations to broaden his antibiotics initially started on ceftriaxone and vancomycin to linezolid and Zosyn.  Will plan to send tissue cultures.  Had a frank and honest conversation with the family today.  He may require further debridement down the road and possible reconstruction depending on the degree of involvement once the necrotic tissue has been excised.  Additional risk including bleeding and pain also discussed.  All questions answered.  Care was coordinated with pharmacy and admitting hospitalist, Dr. Alvester Morin who was agreed to admit the patient.  2.  Uncontrolled diabetes-contributing factor to #1.  Treatment per hospitalist service.  11/09/2023, 3:13 PM  Vanna Scotland,  MD

## 2023-11-09 NOTE — ED Notes (Signed)
Assumed care of patient at 1100. Received report from [facility].

## 2023-11-09 NOTE — ED Triage Notes (Signed)
Pt via POV from home. Pt c/o groin pain and swelling since Thursday. Denies urinary symptoms. Denies any penile discharge. Pt is A&Ox4 and NAD

## 2023-11-09 NOTE — Assessment & Plan Note (Signed)
Meeting sepsis criteria w/ HR 100s, WBC 21  Lactate 3.1  Noted genitourinary source w/ fournier's gangrene  Panculture  IV zosyn, clindamycin and vancomycin for infectious coverage per urology recommendations  LR MIVF  Trend lactate  Follow closely

## 2023-11-09 NOTE — Consult Note (Signed)
Pharmacy Antibiotic Note  Grant Curry is a 27 y.o. male admitted on 11/09/2023 with Fournier's gangrene. Patient reports left scrotal enlargement that began approximately 6 days ago. Ultrasound of the scrotum shows diffuse asymmetric thickening along the left scrotal wall with no discrete drainable abscess or collection. Pharmacy has been consulted for Zosyn dosing.  Today, 11/09/2023 Day 1 of antibiotics  WBC 21.4 Afebrile  Scr 1.05 today with estimated CrCl 115 mL/min   Plan: Start Zosyn 3.375 gm IV Q8H based on current renal function  Patient also on linezolid 600 mg IV Q12H Pharmacy will continue to monitor and dose adjust app  Height: 6' (182.9 cm) Weight: 83.9 kg (185 lb) IBW/kg (Calculated) : 77.6  Temp (24hrs), Avg:98.6 F (37 C), Min:98.3 F (36.8 C), Max:98.8 F (37.1 C)  Recent Labs  Lab 11/09/23 1203 11/09/23 1220 11/09/23 1405  WBC 21.4*  --   --   CREATININE 1.05  --   --   LATICACIDVEN  --  3.1* 1.8    Estimated Creatinine Clearance: 115 mL/min (by C-G formula based on SCr of 1.05 mg/dL).    No Known Allergies  Antimicrobials this admission: Ceftriaxone 12/4 x 1 Vancomycin 12/4 x 1  Zosyn 12/4 >>  Linezolid 12/4 >>   Dose adjustments this admission:  Microbiology results: 12/4 BCx (one set): sent  Thank you for allowing pharmacy to be a part of this patient's care.  Littie Deeds, PharmD Pharmacy Resident  11/09/2023 3:36 PM

## 2023-11-09 NOTE — Op Note (Signed)
Pre-Op Dx: Fournier's gangrene Post-Op Dx: Same Anesthesia: GETA EBL: 5 mL Complications:  none apparent Specimen: None Procedure: Perineal wound debridement Surgeon: Tonna Boehringer  Indications for procedure: Intraoperative consult by urology for Fournier's gangrene initially starting in the scrotal area.  IntraOp noted to extend towards the perineum and gluteus region.  Urology contacted surgery consult for further assessment.  Description of Procedure:  Procedure taken over from urology.  Examination of the already open wound involving the scrotum noted brown dirty dishwater fluid extending from the posterior aspect of the scrotal wound extending towards the right gluteus.  Overlying skin was incised and more necrotic tissue noted within the deep dermal layer with associated purulent drainage.  The tissue was debrided and the incision extended approximately 4 cm more towards the right gluteus until no further necrotic tissue noted.  No additional purulent drainage was noted either.  Reexamination of the wound within the perineal area noted additional necrotic tissue on the left aspect of the wound as well.  This all removed.  All Necrotic tissue removed using electrocautery and involve the deep dermal layer.  After confirming no additional purulent and dark brown discharge from the perineal area and no visible necrotic tissue, procedure was passed off to urology.  Please see their op note for further details.

## 2023-11-09 NOTE — ED Provider Notes (Signed)
[facility] Provider Note    Event Date/Time   First MD Initiated Contact with Patient 11/09/23 1012     (approximate)   History   Groin Pain   HPI  Grant Curry is a 27 y.o. male   is brought to the ED by parents with complaint of left scrotal enlargement that began approximately 6 days ago.  Patient denies any urinary symptoms, fever, chills, nausea or vomiting.  He denies any penile discharge.  No trauma.  Patient has a history of hypertension and diabetes.  He currently does not have a PCP and family states that he generally goes to an urgent care rarely as he normally does not have any medical problems.  Father states that he has not taken any medication for diabetes because he refuses and has probably been 10 years since any medications have been taken.  At that time he was on metformin.  Patient admits to smoking 1 pack cigarettes per day, occasionally drinks alcohol and smokes marijuana on a daily basis.      Physical Exam   Triage Vital Signs: ED Triage Vitals [11/09/23 1000]  Encounter Vitals Group     BP 132/87     Systolic BP Percentile      Diastolic BP Percentile      Pulse Rate (!) 111     Resp 18     Temp 98.8 F (37.1 C)     Temp Source Oral     SpO2 100 %     Weight 185 lb (83.9 kg)     Height 6' (1.829 m)     Head Circumference      Peak Flow      Pain Score 5     Pain Loc      Pain Education      Exclude from Growth Chart     Most recent vital signs: Vitals:   11/09/23 1300 11/09/23 1424  BP: 135/80 135/80  Pulse: 98 98  Resp:  16  Temp:  98.8 F (37.1 C)  SpO2:  97%     General: Awake, no distress.  Able to answer simple questions, cooperative, appears mentally delayed. CV:  Good peripheral perfusion.  Heart regular rate rhythm. Resp:  Normal effort.  Lungs are clear bilaterally. Abd:  No distention.  Other:  Examination of the genitals there is no penile discharge however there is marked edema to the left  scrotum and markedly tender to palpation.  Second exam after pain medications been given shows a ecchymotic/purplish area posteriorly measuring approximately 2 cm suggestive of Fournier's gangrene.   ED Results / Procedures / Treatments   Labs (all labs ordered are listed, but only abnormal results are displayed) Labs Reviewed  URINALYSIS, ROUTINE W REFLEX MICROSCOPIC - Abnormal; Notable for the following components:      Result Value   Color, Urine YELLOW (*)    APPearance CLEAR (*)    Glucose, UA >=500 (*)    Hgb urine dipstick SMALL (*)    Ketones, ur 20 (*)    Protein, ur 30 (*)    Bacteria, UA RARE (*)    All other components within normal limits  CBC WITH DIFFERENTIAL/PLATELET - Abnormal; Notable for the following components:   WBC 21.4 (*)    Neutro Abs 18.3 (*)    Abs Immature Granulocytes 0.26 (*)    All other components within normal limits  COMPREHENSIVE METABOLIC PANEL - Abnormal; Notable for the following components:  Sodium 127 (*)    Chloride 90 (*)    Glucose, Bld 434 (*)    Total Bilirubin 1.7 (*)    All other components within normal limits  LACTIC ACID, PLASMA - Abnormal; Notable for the following components:   Lactic Acid, Venous 3.1 (*)    All other components within normal limits  CHLAMYDIA/NGC RT PCR ([facility] ONLY)            CULTURE, BLOOD (SINGLE)  LACTIC ACID, PLASMA  HEMOGLOBIN A1C  CBG MONITORING, ED      RADIOLOGY  US Scrotum with Doppler radiology report pending  CT abdomen pelvis with contrast ordered.   PROCEDURES:  Critical Care performed:   Procedures   MEDICATIONS ORDERED IN ED: Medications  sodium chloride 0.9 % bolus 1,000 mL (1,000 mLs Intravenous New Bag/Given 11/09/23 1457)    And  sodium chloride 0.9 % bolus 1,000 mL (1,000 mLs Intravenous New Bag/Given 11/09/23 1501)  vancomycin (VANCOCIN) IVPB 1000 mg/200 mL premix (1,000 mg Intravenous New Bag/Given 11/09/23 1419)  insulin aspart (novoLOG) injection 0-15 Units (has no  administration in time range)  insulin aspart (novoLOG) injection 0-5 Units (has no administration in time range)  insulin aspart (novoLOG) injection 4 Units (has no administration in time range)  iohexol (OMNIPAQUE) 300 MG/ML solution 100 mL (has no administration in time range)  clindamycin (CLEOCIN) IVPB 600 mg (has no administration in time range)  HYDROcodone-acetaminophen (NORCO/VICODIN) 5-325 MG per tablet 1 tablet (1 tablet Oral Given 11/09/23 1224)  ondansetron (ZOFRAN) injection 4 mg (4 mg Intravenous Given 11/09/23 1226)  insulin aspart (novoLOG) injection 10 Units (10 Units Intravenous Given 11/09/23 1314)  sodium chloride 0.9 % bolus 1,000 mL (1,000 mLs Intravenous New Bag/Given 11/09/23 1459)  cefTRIAXone (ROCEPHIN) 2 g in sodium chloride 0.9 % 100 mL IVPB (0 g Intravenous Stopped 11/09/23 1457)     IMPRESSION / MDM / ASSESSMENT AND PLAN / ED COURSE  I reviewed the triage vital signs and the nursing notes.   Differential diagnosis includes, but is not limited to, scrotal abscess, left epididymitis, left orchitis, Fournier's gangrene considered, diabetes with patient being medically noncompliant.  ----------------------------------------- 2:15 PM on 11/09/2023 ----------------------------------------- Dr. Roxan Hockey is in to see patient and started sepsis protocol.    Consulted Dr. Vanna Scotland who is on-call for urology who recommends a CT abdomen/pelvis with contrast stat.  Consult for hospitalist for admission. Father was made aware of concerns.    ----------------------------------------- 2:52 PM on 11/09/2023 ----------------------------------------- Spoke with Dr. Alvester Morin, hospitalist at [facility].  He will be coming to see the patient.  Discussed patient's condition and lab findings and plans for admission.      Patient's presentation is most consistent with acute presentation with potential threat to life or bodily function.  FINAL CLINICAL IMPRESSION(S) / ED DIAGNOSES    Final diagnoses:  Fournier's gangrene of scrotum  Hyperglycemia due to diabetes mellitus (HCC)  Medically noncompliant  Hyponatremia     Rx / DC Orders   ED Discharge Orders     None        Note:  This document was prepared using Dragon voice recognition software and may include unintentional dictation errors.   Tommi Rumps, PA-C 11/09/23 1516    Willy Eddy, MD 11/10/23 [phone]

## 2023-11-09 NOTE — Assessment & Plan Note (Signed)
Blood sugar in 400s on presentation w/o overt DKA  Baseline poorly controlled DM- not on medication  SSI  A1C  Discussed importance of appropriate blood sugar management Diabetic education  Follow

## 2023-11-09 NOTE — Consult Note (Signed)
ED Pharmacy Antibiotic Sign Off An antibiotic consult was received from an ED provider for vancomycin per pharmacy dosing for cellulitis. A chart review was completed to assess appropriateness.   The following one time order(s) were placed: Vancomycin 1 gm   Further antibiotic and/or antibiotic pharmacy consults should be ordered by the admitting provider if indicated.   Thank you for allowing pharmacy to be a part of this patient's care.   Littie Deeds, PharmD Pharmacy Resident  11/09/2023 2:04 PM

## 2023-11-09 NOTE — Consult Note (Signed)
CODE SEPSIS - PHARMACY COMMUNICATION  **Broad Spectrum Antibiotics should be administered within 1 hour of Sepsis diagnosis**  Time Code Sepsis Called/Page Received: 1353   Antibiotics Ordered: Vancomycin & Ceftriaxone   Time of 1st antibiotic administration: 1419  Additional action taken by pharmacy: N/A  If necessary, Name of Provider/Nurse Contacted: N/A  Littie Deeds, PharmD Pharmacy Resident  11/09/2023 2:03 PM

## 2023-11-09 NOTE — Anesthesia Preprocedure Evaluation (Signed)
Anesthesia Evaluation  Patient identified by MRN, date of birth, ID band Patient awake    Reviewed: Allergy & Precautions, NPO status , Patient's Chart, lab work & pertinent test results  History of Anesthesia Complications Negative for: history of anesthetic complications  Airway Mallampati: I   Neck ROM: Full    Dental  (+) Poor Dentition   Pulmonary Current Smoker (1 ppd)Patient did not abstain from smoking.   Pulmonary exam normal breath sounds clear to auscultation       Cardiovascular hypertension, Normal cardiovascular exam Rhythm:Regular Rate:Normal     Neuro/Psych negative neurological ROS     GI/Hepatic negative GI ROS,,,  Endo/Other  diabetes, Type 2    Renal/GU negative Renal ROS     Musculoskeletal   Abdominal   Peds  Hematology negative hematology ROS (+)   Anesthesia Other Findings   Reproductive/Obstetrics                             Anesthesia Physical Anesthesia Plan  ASA: 2 and emergent  Anesthesia Plan: General   Post-op Pain Management:    Induction: Intravenous and Rapid sequence  PONV Risk Score and Plan: 1 and Ondansetron, Dexamethasone and Treatment may vary due to age or medical condition  Airway Management Planned: Oral ETT  Additional Equipment:   Intra-op Plan:   Post-operative Plan: Extubation in OR  Informed Consent: I have reviewed the patients History and Physical, chart, labs and discussed the procedure including the risks, benefits and alternatives for the proposed anesthesia with the patient or authorized representative who has indicated his/her understanding and acceptance.     Dental advisory given  Plan Discussed with: CRNA  Anesthesia Plan Comments: (Parents at beside for history and consent. Patient consented for risks of anesthesia including but not limited to:  - adverse reactions to medications - damage to eyes, teeth, lips  or other oral mucosa - nerve damage due to positioning  - sore throat or hoarseness - damage to heart, brain, nerves, lungs, other parts of body or loss of life  Informed patient about role of CRNA in peri- and intra-operative care.  Patient voiced understanding.)       Anesthesia Quick Evaluation

## 2023-11-09 NOTE — Assessment & Plan Note (Signed)
1 PPD smoker  Discussed cessation at length  Nicotine patch  Follow

## 2023-11-10 ENCOUNTER — Inpatient Hospital Stay: Payer: Medicaid Other | Admitting: Certified Registered"

## 2023-11-10 ENCOUNTER — Encounter (HOSPITAL_COMMUNITY): Payer: Self-pay

## 2023-11-10 ENCOUNTER — Encounter
Admission: EM | Disposition: A | Discharge status: Other Acute Inpatient Hospital | Payer: Self-pay | Source: Home / Self Care | Attending: Family Medicine

## 2023-11-10 ENCOUNTER — Encounter: Payer: Self-pay | Admitting: Family Medicine

## 2023-11-10 ENCOUNTER — Inpatient Hospital Stay (HOSPITAL_COMMUNITY)
Admission: EM | Admit: 2023-11-10 | Discharge: 2023-11-15 | DRG: 854 | Disposition: A | Discharge status: Home Health | Payer: Medicaid Other | Source: Other Acute Inpatient Hospital | Attending: Family Medicine | Admitting: Family Medicine

## 2023-11-10 DIAGNOSIS — E871 Hypo-osmolality and hyponatremia: Secondary | ICD-10-CM | POA: Diagnosis not present

## 2023-11-10 DIAGNOSIS — N493 Fournier gangrene: Principal | ICD-10-CM | POA: Diagnosis present

## 2023-11-10 DIAGNOSIS — E1152 Type 2 diabetes mellitus with diabetic peripheral angiopathy with gangrene: Secondary | ICD-10-CM | POA: Diagnosis present

## 2023-11-10 DIAGNOSIS — E1165 Type 2 diabetes mellitus with hyperglycemia: Secondary | ICD-10-CM | POA: Diagnosis present

## 2023-11-10 DIAGNOSIS — B9620 Unspecified Escherichia coli [E. coli] as the cause of diseases classified elsewhere: Secondary | ICD-10-CM | POA: Diagnosis present

## 2023-11-10 DIAGNOSIS — A419 Sepsis, unspecified organism: Secondary | ICD-10-CM | POA: Diagnosis present

## 2023-11-10 DIAGNOSIS — I10 Essential (primary) hypertension: Secondary | ICD-10-CM | POA: Diagnosis present

## 2023-11-10 DIAGNOSIS — E1169 Type 2 diabetes mellitus with other specified complication: Secondary | ICD-10-CM

## 2023-11-10 DIAGNOSIS — Z794 Long term (current) use of insulin: Secondary | ICD-10-CM

## 2023-11-10 DIAGNOSIS — F17210 Nicotine dependence, cigarettes, uncomplicated: Secondary | ICD-10-CM | POA: Diagnosis present

## 2023-11-10 HISTORY — PX: SCROTAL EXPLORATION: SHX2386

## 2023-11-10 LAB — COMPREHENSIVE METABOLIC PANEL
ALT: 25 U/L (ref 0–44)
AST: 18 U/L (ref 15–41)
Albumin: 2.7 g/dL — ABNORMAL LOW (ref 3.5–5.0)
Alkaline Phosphatase: 70 U/L (ref 38–126)
Anion gap: 11 (ref 5–15)
BUN: 17 mg/dL (ref 6–20)
CO2: 23 mmol/L (ref 22–32)
Calcium: 7.5 mg/dL — ABNORMAL LOW (ref 8.9–10.3)
Chloride: 101 mmol/L (ref 98–111)
Creatinine, Ser: 1.11 mg/dL (ref 0.61–1.24)
GFR, Estimated: >60 mL/min (ref >60)
Glucose, Bld: 210 mg/dL — ABNORMAL HIGH (ref 70–99)
Potassium: 3.9 mmol/L (ref 3.5–5.1)
Sodium: 135 mmol/L (ref 135–145)
Total Bilirubin: 1.1 mg/dL (ref <1.2)
Total Protein: 5.9 g/dL — ABNORMAL LOW (ref 6.5–8.1)

## 2023-11-10 LAB — HEMOGLOBIN A1C
Hgb A1c MFr Bld: 10.6 % — ABNORMAL HIGH (ref 4.8–5.6)
Mean Plasma Glucose: 257.52 mg/dL

## 2023-11-10 LAB — CBC
HCT: 33.9 % — ABNORMAL LOW (ref 39.0–52.0)
HCT: 34.3 % — ABNORMAL LOW (ref 39.0–52.0)
Hemoglobin: 11.5 g/dL — ABNORMAL LOW (ref 13.0–17.0)
Hemoglobin: 11.7 g/dL — ABNORMAL LOW (ref 13.0–17.0)
MCH: 28.5 pg (ref 26.0–34.0)
MCH: 28.5 pg (ref 26.0–34.0)
MCHC: 33.9 g/dL (ref 30.0–36.0)
MCHC: 34.1 g/dL (ref 30.0–36.0)
MCV: 84.1 fL (ref 80.0–100.0)
MCV: 83.5 fL (ref 80.0–100.0)
Platelets: 215 10*3/uL (ref 150–400)
Platelets: 254 10*3/uL (ref 150–400)
RBC: 4.03 MIL/uL — ABNORMAL LOW (ref 4.22–5.81)
RBC: 4.11 MIL/uL — ABNORMAL LOW (ref 4.22–5.81)
RDW: 12.2 % (ref 11.5–15.5)
RDW: 12.6 % (ref 11.5–15.5)
WBC: 16.5 10*3/uL — ABNORMAL HIGH (ref 4.0–10.5)
WBC: 13.9 10*3/uL — ABNORMAL HIGH (ref 4.0–10.5)
nRBC: 0 % (ref 0.0–0.2)
nRBC: 0 % (ref 0.0–0.2)

## 2023-11-10 LAB — HIV ANTIBODY (ROUTINE TESTING W REFLEX): HIV Screen 4th Generation wRfx: NONREACTIVE

## 2023-11-10 LAB — GLUCOSE, CAPILLARY
Glucose-Capillary: 188 mg/dL — ABNORMAL HIGH (ref 70–99)
Glucose-Capillary: 190 mg/dL — ABNORMAL HIGH (ref 70–99)
Glucose-Capillary: 330 mg/dL — ABNORMAL HIGH (ref 70–99)
Glucose-Capillary: 290 mg/dL — ABNORMAL HIGH (ref 70–99)

## 2023-11-10 LAB — CREATININE, SERUM
Creatinine, Ser: 1.14 mg/dL (ref 0.61–1.24)
GFR, Estimated: >60 mL/min (ref >60)

## 2023-11-10 SURGERY — EXPLORATION, SCROTUM
Anesthesia: General | Laterality: Bilateral

## 2023-11-10 MED ORDER — INSULIN GLARGINE-YFGN 100 UNIT/ML ~~LOC~~ SOLN
8.0000 [IU] | Freq: Every day | SUBCUTANEOUS | Status: DC
  Filled 2023-11-10: qty 0.08

## 2023-11-10 MED ORDER — LINEZOLID 600 MG/300ML IV SOLN: 600.0000 mg | Freq: Two times a day (BID) | INTRAVENOUS | Status: DC

## 2023-11-10 MED ORDER — MIDAZOLAM HCL 2 MG/2ML IJ SOLN
INTRAMUSCULAR | Status: DC | PRN
  Administered 2023-11-10: 2 mg via INTRAVENOUS

## 2023-11-10 MED ORDER — OXYCODONE HCL 5 MG PO TABS
5.0000 mg | ORAL_TABLET | ORAL | Status: DC | PRN
  Administered 2023-11-11 – 2023-11-15 (×10): 5 mg via ORAL
  Filled 2023-11-10 (×10): qty 1

## 2023-11-10 MED ORDER — INSULIN ASPART 100 UNIT/ML IJ SOLN
0.0000 [IU] | Freq: Three times a day (TID) | INTRAMUSCULAR | Status: DC
  Administered 2023-11-11: 2 [IU] via SUBCUTANEOUS

## 2023-11-10 MED ORDER — BUPIVACAINE HCL 0.5 % IJ SOLN
INTRAMUSCULAR | Status: DC | PRN
  Administered 2023-11-10: 20 mL

## 2023-11-10 MED ORDER — ONDANSETRON HCL 4 MG/2ML IJ SOLN
INTRAMUSCULAR | Status: AC
  Filled 2023-11-10: qty 2

## 2023-11-10 MED ORDER — PROPOFOL 10 MG/ML IV BOLUS
INTRAVENOUS | Status: AC
  Filled 2023-11-10: qty 20

## 2023-11-10 MED ORDER — LIVING WELL WITH DIABETES BOOK
Freq: Once | Status: AC
  Filled 2023-11-10: qty 1

## 2023-11-10 MED ORDER — ONDANSETRON HCL 4 MG/2ML IJ SOLN
4.0000 mg | Freq: Four times a day (QID) | INTRAMUSCULAR | Status: DC | PRN
  Administered 2023-11-13: 4 mg via INTRAVENOUS
  Filled 2023-11-10: qty 2

## 2023-11-10 MED ORDER — DEXMEDETOMIDINE HCL IN NACL 80 MCG/20ML IV SOLN
INTRAVENOUS | Status: DC | PRN
  Administered 2023-11-10 (×2): 8 ug via INTRAVENOUS
  Administered 2023-11-10: 4 ug via INTRAVENOUS

## 2023-11-10 MED ORDER — FENTANYL CITRATE (PF) 100 MCG/2ML IJ SOLN: 25.0000 ug | INTRAMUSCULAR | Status: DC | PRN

## 2023-11-10 MED ORDER — OXYCODONE HCL 5 MG/5ML PO SOLN
5.0000 mg | Freq: Once | ORAL | Status: DC | PRN
Start: 2023-11-10 — End: 2023-11-10

## 2023-11-10 MED ORDER — ACETAMINOPHEN 10 MG/ML IV SOLN
INTRAVENOUS | Status: AC
  Filled 2023-11-10: qty 100

## 2023-11-10 MED ORDER — INSULIN GLARGINE-YFGN 100 UNIT/ML ~~LOC~~ SOLN: 8.0000 [IU] | Freq: Every day | SUBCUTANEOUS | Status: DC

## 2023-11-10 MED ORDER — ONDANSETRON HCL 4 MG PO TABS: 4.0000 mg | ORAL_TABLET | Freq: Four times a day (QID) | ORAL | Status: DC | PRN

## 2023-11-10 MED ORDER — PROPOFOL 10 MG/ML IV BOLUS
INTRAVENOUS | Status: DC | PRN
  Administered 2023-11-10: 200 mg via INTRAVENOUS

## 2023-11-10 MED ORDER — HEPARIN SODIUM (PORCINE) 5000 UNIT/ML IJ SOLN
5000.0000 [IU] | Freq: Three times a day (TID) | INTRAMUSCULAR | Status: DC
  Administered 2023-11-11 – 2023-11-15 (×14): 5000 [IU] via SUBCUTANEOUS
  Filled 2023-11-10 (×14): qty 1

## 2023-11-10 MED ORDER — 0.9 % SODIUM CHLORIDE (POUR BTL) OPTIME
TOPICAL | Status: DC | PRN
  Administered 2023-11-10: 500 mL

## 2023-11-10 MED ORDER — SODIUM CHLORIDE 0.9 % IV SOLN: INTRAVENOUS | Status: DC | PRN

## 2023-11-10 MED ORDER — ACETAMINOPHEN 650 MG RE SUPP: 650.0000 mg | Freq: Four times a day (QID) | RECTAL | Status: DC | PRN

## 2023-11-10 MED ORDER — DEXAMETHASONE SODIUM PHOSPHATE 10 MG/ML IJ SOLN
INTRAMUSCULAR | Status: AC
  Filled 2023-11-10: qty 1

## 2023-11-10 MED ORDER — LIDOCAINE HCL (PF) 2 % IJ SOLN
INTRAMUSCULAR | Status: AC
  Filled 2023-11-10: qty 5

## 2023-11-10 MED ORDER — ALBUTEROL SULFATE HFA 108 (90 BASE) MCG/ACT IN AERS
INHALATION_SPRAY | RESPIRATORY_TRACT | Status: AC
  Filled 2023-11-10: qty 6.7

## 2023-11-10 MED ORDER — MIDAZOLAM HCL 2 MG/2ML IJ SOLN
INTRAMUSCULAR | Status: AC
Start: 2023-11-10 — End: ?
  Filled 2023-11-10: qty 2

## 2023-11-10 MED ORDER — CHLORHEXIDINE GLUCONATE CLOTH 2 % EX PADS
6.0000 | MEDICATED_PAD | Freq: Every day | CUTANEOUS | Status: DC
Start: 2023-11-10 — End: 2023-11-10
  Administered 2023-11-10: 6 via TOPICAL

## 2023-11-10 MED ORDER — INSULIN GLARGINE-YFGN 100 UNIT/ML ~~LOC~~ SOLN
8.0000 [IU] | Freq: Every day | SUBCUTANEOUS | Status: DC
  Filled 2023-11-10 (×2): qty 0.08

## 2023-11-10 MED ORDER — DEXAMETHASONE SODIUM PHOSPHATE 10 MG/ML IJ SOLN
INTRAMUSCULAR | Status: DC | PRN
  Administered 2023-11-10: 4 mg via INTRAVENOUS

## 2023-11-10 MED ORDER — ONDANSETRON HCL 4 MG/2ML IJ SOLN
INTRAMUSCULAR | Status: DC | PRN
  Administered 2023-11-10: 4 mg via INTRAVENOUS

## 2023-11-10 MED ORDER — BUPIVACAINE HCL (PF) 0.5 % IJ SOLN
INTRAMUSCULAR | Status: AC
  Filled 2023-11-10: qty 30

## 2023-11-10 MED ORDER — LACTATED RINGERS IV SOLN: INTRAVENOUS | Status: AC

## 2023-11-10 MED ORDER — LIDOCAINE HCL (CARDIAC) PF 100 MG/5ML IV SOSY
PREFILLED_SYRINGE | INTRAVENOUS | Status: DC | PRN
  Administered 2023-11-10: 60 mg via INTRAVENOUS

## 2023-11-10 MED ORDER — ACETAMINOPHEN 10 MG/ML IV SOLN
INTRAVENOUS | Status: DC | PRN
  Administered 2023-11-10: 1000 mg via INTRAVENOUS

## 2023-11-10 MED ORDER — FENTANYL CITRATE (PF) 100 MCG/2ML IJ SOLN
INTRAMUSCULAR | Status: AC
  Filled 2023-11-10: qty 2

## 2023-11-10 MED ORDER — ACETAMINOPHEN 10 MG/ML IV SOLN: 1000.0000 mg | Freq: Once | INTRAVENOUS | Status: DC | PRN

## 2023-11-10 MED ORDER — ALBUTEROL SULFATE (2.5 MG/3ML) 0.083% IN NEBU: 2.5000 mg | INHALATION_SOLUTION | RESPIRATORY_TRACT | Status: DC | PRN

## 2023-11-10 MED ORDER — IRRISEPT - 450ML BOTTLE WITH 0.05% CHG IN STERILE WATER, USP 99.95% OPTIME
TOPICAL | Status: DC | PRN
  Administered 2023-11-10: 450 mL

## 2023-11-10 MED ORDER — PIPERACILLIN-TAZOBACTAM 3.375 G IVPB
3.3750 g | Freq: Three times a day (TID) | INTRAVENOUS | Status: DC
  Administered 2023-11-11 – 2023-11-13 (×8): 3.375 g via INTRAVENOUS
  Filled 2023-11-10 (×7): qty 50

## 2023-11-10 MED ORDER — ALBUTEROL SULFATE HFA 108 (90 BASE) MCG/ACT IN AERS
INHALATION_SPRAY | RESPIRATORY_TRACT | Status: DC | PRN
  Administered 2023-11-10: 4 via RESPIRATORY_TRACT
  Administered 2023-11-10: 6 via RESPIRATORY_TRACT

## 2023-11-10 MED ORDER — HYDROCODONE-ACETAMINOPHEN 5-325 MG PO TABS: 1.0000 | ORAL_TABLET | Freq: Four times a day (QID) | ORAL | Status: DC | PRN

## 2023-11-10 MED ORDER — PIPERACILLIN-TAZOBACTAM 3.375 G IVPB: 3.3750 g | Freq: Three times a day (TID) | INTRAVENOUS | Status: DC

## 2023-11-10 MED ORDER — ACETAMINOPHEN 325 MG PO TABS
650.0000 mg | ORAL_TABLET | Freq: Four times a day (QID) | ORAL | Status: DC | PRN
  Administered 2023-11-11 – 2023-11-12 (×2): 650 mg via ORAL
  Filled 2023-11-10 (×2): qty 2

## 2023-11-10 MED ORDER — LINEZOLID 600 MG/300ML IV SOLN
600.0000 mg | Freq: Two times a day (BID) | INTRAVENOUS | Status: DC
  Administered 2023-11-11 – 2023-11-12 (×5): 600 mg via INTRAVENOUS
  Filled 2023-11-10 (×6): qty 300

## 2023-11-10 MED ORDER — OXYCODONE HCL 5 MG PO TABS: 5.0000 mg | ORAL_TABLET | Freq: Once | ORAL | Status: DC | PRN

## 2023-11-10 MED ORDER — FENTANYL CITRATE (PF) 100 MCG/2ML IJ SOLN
INTRAMUSCULAR | Status: DC | PRN
  Administered 2023-11-10 (×2): 50 ug via INTRAVENOUS
  Administered 2023-11-10: 4 ug via INTRAVENOUS

## 2023-11-10 SURGICAL SUPPLY — 20 items
BLADE SURG 15 STRL LF DISP TIS (BLADE) ×1 IMPLANT
BNDG STRETCH GAUZE 3IN X12FT (GAUZE/BANDAGES/DRESSINGS) IMPLANT
BRIEF MESH DISP 2XL (UNDERPADS AND DIAPERS) IMPLANT
DRAPE LAPAROTOMY 100X77 ABD (DRAPES) ×1 IMPLANT
ELECT REM PT RETURN 9FT ADLT (ELECTROSURGICAL) ×1
ELECTRODE REM PT RTRN 9FT ADLT (ELECTROSURGICAL) ×1 IMPLANT
GAUZE 4X4 16PLY ~~LOC~~+RFID DBL (SPONGE) ×1 IMPLANT
GLOVE BIO SURGEON STRL SZ 6.5 (GLOVE) ×1 IMPLANT
GLOVE INDICATOR 7.0 STRL GRN (GLOVE) ×1 IMPLANT
GOWN STRL REUS W/ TWL LRG LVL3 (GOWN DISPOSABLE) ×2 IMPLANT
JET LAVAGE IRRISEPT WOUND (IRRIGATION / IRRIGATOR) ×1
KIT TURNOVER KIT A (KITS) ×1 IMPLANT
LAVAGE JET IRRISEPT WOUND (IRRIGATION / IRRIGATOR) IMPLANT
MANIFOLD NEPTUNE II (INSTRUMENTS) ×1 IMPLANT
NS IRRIG 500ML POUR BTL (IV SOLUTION) ×1 IMPLANT
PACK BASIN MINOR ARMC (MISCELLANEOUS) ×1 IMPLANT
PAD ABD DERMACEA PRESS 5X9 (GAUZE/BANDAGES/DRESSINGS) IMPLANT
SOL PREP PVP 2OZ (MISCELLANEOUS) ×1
SOLUTION PREP PVP 2OZ (MISCELLANEOUS) ×1 IMPLANT
TRAP FLUID SMOKE EVACUATOR (MISCELLANEOUS) ×1 IMPLANT

## 2023-11-10 NOTE — Transfer of Care (Signed)
Immediate Anesthesia Transfer of Care Note  Patient: Grant Curry  Procedure(s) Performed: SCROTUM EXPLORATION (Bilateral)  Patient Location: PACU  Anesthesia Type:General  Level of Consciousness: drowsy  Airway & Oxygen Therapy: Patient Spontanous Breathing and Patient connected to face mask oxygen  Post-op Assessment: Report given to RN and Post -op Vital signs reviewed and stable  Post vital signs: Reviewed and stable  Last Vitals:  Vitals Value Taken Time  BP 108/65 11/10/23 1116  Temp    Pulse 73 11/10/23 1119  Resp 19 11/10/23 1119  SpO2 100 % 11/10/23 1119  Vitals shown include unfiled device data.  Last Pain:  Vitals:   11/10/23 1115  TempSrc:   PainSc: Asleep      Patients Stated Pain Goal: 0 (11/10/23 0953)  Complications: No notable events documented.

## 2023-11-10 NOTE — Anesthesia Procedure Notes (Signed)
Procedure Name: LMA Insertion Date/Time: 11/10/2023 10:40 AM  Performed by: Hezzie Bump, CRNAPre-anesthesia Checklist: Patient identified, Patient being monitored, Timeout performed, Emergency Drugs available and Suction available Patient Re-evaluated:Patient Re-evaluated prior to induction Oxygen Delivery Method: Circle system utilized Preoxygenation: Pre-oxygenation with 100% oxygen Induction Type: IV induction Ventilation: Mask ventilation without difficulty LMA: LMA with gastric port inserted and LMA inserted LMA Size: 4.0 Tube type: Oral Number of attempts: 1 Placement Confirmation: positive ETCO2 and breath sounds checked- equal and bilateral Tube secured with: Tape Dental Injury: Teeth and Oropharynx as per pre-operative assessment

## 2023-11-10 NOTE — Discharge Summary (Addendum)
Physician Discharge Summary   Patient: Grant Curry MRN: [id number] DOB: 09/15/1996  Admit date:     11/09/2023  Discharge date: 11/10/23  Discharge Physician: Lurene Shadow   PCP: Pcp, No   Recommendations at discharge:   Follow-up with hospitalist team and plastic surgeon at [facility]  Discharge Diagnoses: Principal Problem:   Fournier gangrene Active Problems:   Sepsis (HCC)   Hyperglycemia   Tobacco abuse  Resolved Problems:   * No resolved hospital problems. [facility] Course:  Grant Curry is a 27 y.o. male with medical history significant for hypertension, diabetes mellitus, tobacco use disorder, who presented to the hospital because of groin pain and scrotal swelling.   He was noted to have sepsis secondary to Fournier's gangrene.  He was treated with analgesia, empiric IV antibiotics and IV fluids.     Assessment and Plan: Sepsis secondary to Fournier's gangrene: S/p scrotal exploration with surgical debridement and pressure perineal debridement on 11/09/2023 and 11/10/2023.  Continue IV Zosyn and IV Zyvox.  Analgesics as needed for pain.  Case discussed with Dr. Apolinar Junes, urologist.  She recommended that patient be transferred to [facility] to be evaluated by plastic surgeon for primary closure of surgical scrotal wound.  She said she had discussed the case with Dr. Ulice Bold and Dr. Ladona Ridgel.  Please consult plastic surgeon when patient arrives at [facility]     Type II DM with severe hyperglycemia: He was started on Semglee 8 units daily, NovoLog 4 units 3 times daily with meals and NovoLog as needed for hyperglycemia.  Follow-up with diabetic coordinator. Hemoglobin A1c was 10.6. Patient said that he had taken some hypoglycemic agents in the past but he stopped taking them because "it made me sick".  He said it caused vomiting and diarrhea and he won't take anything that will make him sick. His mother said that she plans to take him to a physician  in [place] who also happens to see patient's dad.     Tobacco use disorder: Counseled to quit smoking cigarettes    He is deemed stable for discharge.  Discharge plan discussed with the patient, his mother and father at the bedside.  They are agreeable with the plan.  Initially, patient was upset about his sudden change in plans.  He said he was informed by Dr. Apolinar Junes, urologist, that he would need to see a plastic surgeon "down the line", about a week from day of surgery.  Therefore, he was surprised about the sudden change in plans to see a plastic surgeon urgently.  He is agreeable to transfer to [facility] to see a plastic surgeon for further management of scrotal wound.      Consultants: Neurologist Procedures performed: Scrotal exploration with surgical debridement and partial perineal debridement   Disposition:  [facility] Diet recommendation:  Discharge Diet Orders (From admission, onward)     Start     Ordered   11/10/23 0000  Diet Carb Modified        11/10/23 1743           Carb modified diet DISCHARGE MEDICATION: Allergies as of 11/10/2023   No Known Allergies      Medication List     TAKE these medications    insulin glargine-yfgn 100 UNIT/ML injection Commonly known as: SEMGLEE Inject 0.08 mLs (8 Units total) into the skin daily.   linezolid 600 MG/300ML IVPB Commonly known as: ZYVOX Inject 300 mLs (600 mg total) into  the vein every 12 (twelve) hours.   piperacillin-tazobactam 3.375 GM/50ML IVPB Commonly known as: ZOSYN Inject 50 mLs (3.375 g total) into the vein every 8 (eight) hours.        Follow-up Information     Grant Miner, MD. Go on 01/04/2024.   Specialty: Family Medicine Why: Please arrive at 910 am Bring a picutre ID, piece of mail with your name and address, copy of your parent's pay stub, and copay Contact information: [address] [place] [address] [phone]                 Discharge Exam: Grant Curry Weights   11/09/23 1000 11/10/23 0953  Weight: 83.9 kg 83.9 kg   GEN: NAD SKIN: Warm and dry EYES: No pallor or icterus ENT: MMM CV: RRR PULM: CTA B ABD: soft, ND, NT, +BS CNS: AAO x 3, non focal EXT: No edema or tenderness GU: Open surgical wound on the left side of the scrotum packed with gauze.  Scrotum looks erythematous and it is mildly tender.  Foley catheter in place    Condition at discharge: good  The results of significant diagnostics from this hospitalization (including imaging, microbiology, ancillary and laboratory) are listed below for reference.   Imaging Studies: CT ABDOMEN PELVIS W CONTRAST  Result Date: 11/09/2023 CLINICAL DATA:  Fournier's gangrene.  Left scrotal swelling. EXAM: CT ABDOMEN AND PELVIS WITH CONTRAST TECHNIQUE: Multidetector CT imaging of the abdomen and pelvis was performed using the standard protocol following bolus administration of intravenous contrast. RADIATION DOSE REDUCTION: This exam was performed according to the departmental dose-optimization program which includes automated exposure control, adjustment of the mA and/or kV according to patient size and/or use of iterative reconstruction technique. CONTRAST:  OMNIPAQUE IOHEXOL 300 MG/ML  SOLN COMPARISON:  Ultrasound scrotum and Doppler exam from earlier the same day. FINDINGS: Lower chest: There is a 6.3 x 8.2 mm solid noncalcified nodule in the right lung lower lobe abutting the pleura. Please see follow-up recommendations below. The lung bases are otherwise clear. No pleural effusion. The heart is normal in size. No pericardial effusion. Hepatobiliary: The liver is normal in size. Non-cirrhotic configuration. No suspicious mass. There is a geographic, small, hypoattenuating area along the falciform ligament attachment site, which even though incompletely characterized on the current examination, favored to represent focal fatty infiltration. Attention on  follow-up examination is recommended. No intrahepatic or extrahepatic bile duct dilation. Small volume dependent gallstones/sludge noted without imaging signs of acute cholecystitis. Normal gallbladder wall thickness. No pericholecystic inflammatory changes. Pancreas: Unremarkable. No pancreatic ductal dilatation or surrounding inflammatory changes. Spleen: Within normal limits. No focal lesion. Adrenals/Urinary Tract: Adrenal glands are unremarkable. No suspicious renal mass. No hydronephrosis. No renal or ureteric calculi. Unremarkable urinary bladder. Stomach/Bowel: No disproportionate dilation of the small or large bowel loops. No evidence of abnormal bowel wall thickening or inflammatory changes. The appendix is unremarkable. Vascular/Lymphatic: No ascites or pneumoperitoneum. No abdominal or pelvic lymphadenopathy, by size criteria. No aneurysmal dilation of the major abdominal arteries. Reproductive: Normal-sized prostate gland. Symmetric bilateral seminal vesicles. Along the left side of the scrotum, there is diffuse hypoattenuating area. The internal attenuation of the area is 20-22 Hounsfield units. There is no discrete peripheral wall. There is small amount of such area along the superior anterior aspect of the scrotum also, surrounding both testicles. No evidence of air within the soft tissue. Findings may represent diffuse scrotal wall edema/cellulitis without frank abscess. Other: There is a tiny fat containing  umbilical hernia. The soft tissues and abdominal wall are otherwise unremarkable. Musculoskeletal: No suspicious osseous lesions. There are mild multilevel degenerative changes in the visualized spine. IMPRESSION: *There are low-attenuation areas predominantly along the left side of the scrotum which also extends surrounding both testicles. No evidence of frank walled-off abscess or air within the soft tissue. Findings may represent diffuse scrotal wall edema/cellulitis without frank abscess.  *Multiple other nonacute observations, as described above. Electronically Signed   By: Jules Schick M.D.   On: 11/09/2023 16:54   US SCROTUM W/DOPPLER  Result Date: 11/09/2023 CLINICAL DATA:  [id number] Swelling of left half of scrotum [id number] EXAM: SCROTAL ULTRASOUND DOPPLER ULTRASOUND OF THE TESTICLES TECHNIQUE: Complete ultrasound examination of the testicles, epididymis, and other scrotal structures was performed. Color and spectral Doppler ultrasound were also utilized to evaluate blood flow to the testicles. COMPARISON:  None Available. FINDINGS: Right testicle Measurements: 2.0 x 2.9 x 4.8 cm. No mass or microlithiasis visualized. Left testicle Measurements: 2.0 x 2.8 x 4.4 cm. No mass or microlithiasis visualized. Right epididymis:  Normal in size and appearance. Left epididymis:  Normal in size and appearance. Hydrocele:  None visualized. Varicocele:  None visualized. Pulsed Doppler interrogation of both testes demonstrates normal low resistance arterial and venous waveforms bilaterally. There is diffuse asymmetric thickening along the left scrotal wall, as seen on the provided images. There are heterogeneous hypoechoic areas; however, no discrete drainable abscess or collection. IMPRESSION: *There is diffuse asymmetric thickening along the left scrotal wall, as seen on the provided images. There are heterogeneous hypoechoic areas; however, no discrete drainable abscess or collection. Findings may represent subcutaneous edema. Correlate clinically. *Otherwise unremarkable exam. Electronically Signed   By: Jules Schick M.D.   On: 11/09/2023 15:21    Microbiology: Results for orders placed or performed during the hospital encounter of 11/09/23  Chlamydia/NGC rt PCR ([facility] only)     Status: None   Collection Time: 11/09/23 10:01 AM   Specimen: Urine; GU  Result Value Ref Range Status   Specimen source GC/Chlam URINE, RANDOM  Final   Chlamydia Tr NOT DETECTED NOT DETECTED Final   N gonorrhoeae NOT  DETECTED NOT DETECTED Final    Comment: (NOTE) This CT/NG assay has not been evaluated in patients with a history of  hysterectomy. Performed at [facility], [address] [place], [place] [address]   Culture, blood (single)     Status: None (Preliminary result)   Collection Time: 11/09/23  2:05 PM   Specimen: BLOOD RIGHT ARM  Result Value Ref Range Status   Specimen Description BLOOD RIGHT ARM  Final   Special Requests   Final    BOTTLES DRAWN AEROBIC AND ANAEROBIC Blood Culture adequate volume   Culture  Setup Time IN BOTH AEROBIC AND ANAEROBIC BOTTLES  Final   Culture   Final    NO ORGANISMS SEEN Performed at [facility], [address] [place], [place] [address]    Report Status PENDING  Incomplete  Aerobic/Anaerobic Culture w Gram Stain (surgical/deep wound)     Status: None (Preliminary result)   Collection Time: 11/09/23  5:07 PM   Specimen: Path Tissue  Result Value Ref Range Status   Specimen Description   Final    TISSUE Performed at [facility], [address], [place], [place] [address]    Special Requests SCROTAL TISSUE SPEC A  Final   Gram Stain   Final    RARE WBC PRESENT, PREDOMINANTLY MONONUCLEAR RARE GRAM POSITIVE COCCI  IN PAIRS Performed at [facility], [address] [place], [place] [address]    Culture PENDING  Incomplete   Report Status PENDING  Incomplete    Labs: CBC: Recent Labs  Lab 11/09/23 1203 11/09/23 1945 11/10/23 0549  WBC 21.4* 14.7* 16.5*  NEUTROABS 18.3*  --   --   HGB 14.0 10.6* 11.5*  HCT 40.2 30.7* 33.9*  MCV 81.2 82.7 84.1  PLT 279 199 215   Basic Metabolic Panel: Recent Labs  Lab 11/09/23 1203 11/09/23 1945 11/10/23 0549  NA 127*  --  135  K 4.0  --  3.9  CL 90*  --  101  CO2 23  --  23  GLUCOSE 434*  --  210*  BUN 18  --  17  CREATININE 1.05 0.84 1.11  CALCIUM 9.0  --  7.5*   Liver Function Tests: Recent Labs  Lab 11/09/23 1203 11/10/23 0549  AST 19 18  ALT 28  25  ALKPHOS 101 70  BILITOT 1.7* 1.1  PROT 8.1 5.9*  ALBUMIN 3.8 2.7*   CBG: Recent Labs  Lab 11/09/23 1612 11/09/23 1816 11/09/23 1958 11/10/23 0829 11/10/23 1120  GLUCAP 222* 220* 247* 188* 190*    Discharge time spent: greater than 30 minutes.  Signed: Lurene Shadow, MD [facility] 11/10/2023

## 2023-11-10 NOTE — Op Note (Signed)
Date of procedure: 11/10/23  Preoperative diagnosis:  Fournier's gangrene  Postoperative diagnosis:  As above  Procedure: Exam under anesthesia/dressing change Scrotal and perineal debridement of Fournier's gangrene  Surgeon: Vanna Scotland, MD  Anesthesia: General  Complications: None  Intraoperative findings: Overall wound integrity good.  Some grayish overlying tissue at the base of the scrotum as well as a necrotic skin edge at the far left scrotal margin.  Perineum overall with excellent wound edges and no additional necrotic tissue.  EBL: Minimal  Specimens: None  Drains: None  Indication: Grant Curry is a 27 y.o. patient with Fournier's gangrene who returns today for second look.  After reviewing the management options for treatment, he elected to proceed with the above surgical procedure(s). We have discussed the potential benefits and risks of the procedure, side effects of the proposed treatment, the likelihood of the patient achieving the goals of the procedure, and any potential problems that might occur during the procedure or recuperation. Informed consent has been obtained.  Description of procedure:  The patient was taken to the operating room and general anesthesia was induced.  The patient was placed in the dorsal lithotomy position.  He is already on continuous antibiotics.  At this point in time, his wound dressing was taken down and the wound was carefully examined.  On inspection, the perineum itself appeared to be in good shape with healthy wound edges and wound bed.  There was no further necrotic tissue appreciated.  Within the scrotum however, there were some grayish planes which appear to be relatively superficial overlying the base of the scrotum as well as 1 slightly necrotic wound edge at the left base of scrotal margin.  Patient was draped.  I then used tenotomy scissors as well as Bovie electric cautery and some blunt dissection to further debride  the base of the scrotum extending down into the superior aspect of the perineum.  I also took off the necrotic skin edge.  Hemostasis was excellent.  We irrigated the wound again with CHG solution.  20 cc of Marcaine was instilled into the wound edges for local anesthetic.  I then redressed the wound using a Kerlix ABD pads and mesh panties.  Patient was then repositioned in supine position, reversed of anesthesia and taken to the PACU in stable condition.  Vanna Scotland, M.D.

## 2023-11-10 NOTE — Plan of Care (Signed)

## 2023-11-10 NOTE — Progress Notes (Addendum)
Progress Note    Grant Curry  ZOX:[id number] DOB: [birth year]  DOA: 11/09/2023 PCP: Pcp, No      Brief Narrative:    Medical records reviewed and are as summarized below:  Grant Curry is a 27 y.o. male with medical history significant for hypertension, diabetes mellitus, tobacco use disorder, who presented to the hospital because of groin pain and scrotal swelling.  He was noted to have sepsis secondary to Fournier's gangrene.  He was treated with analgesia, empiric IV antibiotics and IV fluids.       Assessment/Plan:   Principal Problem:   Fournier gangrene Active Problems:   Sepsis (HCC)   Hyperglycemia   Tobacco abuse    Body mass index is 25.09 kg/m.   Sepsis secondary to Fournier's gangrene: S/p scrotal exploration with surgical debridement and pressure perineal debridement on 11/09/2023 and 11/10/2023.  Continue IV Zosyn and IV Zyvox.  Analgesics as needed for pain.  Follow-up with urologist for further recommendations.   Type II DM with severe hyperglycemia: Start Semglee 8 units daily.  Continue NovoLog 4 units 3 times daily with meals.  Use NovoLog as needed for hyperglycemia.  Follow-up with diabetic coordinator. Hemoglobin A1c was 10.6. Patient said that he had taken some hypoglycemic agents in the past but he stopped taking them because "it made me sick".  He said it caused vomiting and diarrhea and he won't take anything that will make him sick.   Tobacco use disorder: Counseled to quit smoking cigarettes   Diet Order             Diet Carb Modified Fluid consistency: Thin  Diet effective now                            Consultants: Urologist  Procedures: Scrotal exploration with surgical debridement and partial perineal debridement     Medications:    Chlorhexidine Gluconate Cloth  6 each Topical Daily   enoxaparin (LOVENOX) injection  40 mg Subcutaneous Q24H   insulin aspart  0-15 Units Subcutaneous TID WC    insulin aspart  0-5 Units Subcutaneous QHS   insulin aspart  4 Units Subcutaneous TID WC   insulin glargine-yfgn  8 Units Subcutaneous Daily   nicotine  21 mg Transdermal Daily   Continuous Infusions:  linezolid (ZYVOX) IV 600 mg (11/10/23 1458)   piperacillin-tazobactam (ZOSYN)  IV 3.375 g (11/10/23 1315)     Anti-infectives (From admission, onward)    Start     Dose/Rate Route Frequency Ordered Stop   11/09/23 1630  piperacillin-tazobactam (ZOSYN) IVPB 3.375 g        3.375 g 12.5 mL/hr over 240 Minutes Intravenous Every 8 hours 11/09/23 1544     11/09/23 1545  linezolid (ZYVOX) IVPB 600 mg        600 mg 300 mL/hr over 60 Minutes Intravenous Every 12 hours 11/09/23 1543     11/09/23 1530  clindamycin (CLEOCIN) IVPB 600 mg  Status:  Discontinued        600 mg 100 mL/hr over 30 Minutes Intravenous Every 8 hours 11/09/23 1515 11/09/23 1543   11/09/23 1400  vancomycin (VANCOCIN) IVPB 1000 mg/200 mL premix        1,000 mg 200 mL/hr over 60 Minutes Intravenous  Once 11/09/23 1353 11/09/23 1524   11/09/23 1400  cefTRIAXone (ROCEPHIN) 2 g in sodium chloride 0.9 % 100 mL IVPB        2 g  200 mL/hr over 30 Minutes Intravenous  Once 11/09/23 1353 11/09/23 1457              Family Communication/Anticipated D/C date and plan/Code Status   DVT prophylaxis: enoxaparin (LOVENOX) injection 40 mg Start: 11/09/23 2000     Code Status: Full Code  Family Communication: None Disposition Plan: Plan to discharge home   Status is: Inpatient Remains inpatient appropriate because: Fournier's gangrene       Subjective:   Interval events noted.  Pain in scrotum is better.  No other complaints.  Objective:    Vitals:   11/10/23 0953 11/10/23 1115 11/10/23 1130 11/10/23 1145  BP: 113/74 108/65 108/70 108/67  Pulse: 91 76 88 78  Resp: 16 15 15 11   Temp: 98.1 F (36.7 C) 98.5 F (36.9 C) 98.8 F (37.1 C) 98.7 F (37.1 C)  TempSrc: Temporal     SpO2: 100% 100% 100% 100%   Weight: 83.9 kg     Height: 6' (1.829 m)      No data found.   Intake/Output Summary (Last 24 hours) at 11/10/2023 1632 Last data filed at 11/10/2023 1145 Gross per 24 hour  Intake 1965 ml  Output 980 ml  Net 985 ml   Filed Weights   11/09/23 1000 11/10/23 0953  Weight: 83.9 kg 83.9 kg    Exam:  GEN: NAD SKIN: Warm and dry EYES: No pallor or icterus ENT: MMM CV: RRR PULM: CTA B ABD: soft, ND, NT, +BS CNS: AAO x 3, non focal EXT: No edema or tenderness GU: Open surgical wound on the left side of the scrotum packed with gauze.  Scrotum looks erythematous and it is mildly tender.  Foley catheter in place       Data Reviewed:   I have personally reviewed following labs and imaging studies:  Labs: Labs show the following:   Basic Metabolic Panel: Recent Labs  Lab 11/09/23 1203 11/09/23 1945 11/10/23 0549  NA 127*  --  135  K 4.0  --  3.9  CL 90*  --  101  CO2 23  --  23  GLUCOSE 434*  --  210*  BUN 18  --  17  CREATININE 1.05 0.84 1.11  CALCIUM 9.0  --  7.5*   GFR Estimated Creatinine Clearance: 108.7 mL/min (by C-G formula based on SCr of 1.11 mg/dL). Liver Function Tests: Recent Labs  Lab 11/09/23 1203 11/10/23 0549  AST 19 18  ALT 28 25  ALKPHOS 101 70  BILITOT 1.7* 1.1  PROT 8.1 5.9*  ALBUMIN 3.8 2.7*   No results for input(s): "LIPASE", "AMYLASE" in the last 168 hours. No results for input(s): "AMMONIA" in the last 168 hours. Coagulation profile No results for input(s): "INR", "PROTIME" in the last 168 hours.  CBC: Recent Labs  Lab 11/09/23 1203 11/09/23 1945 11/10/23 0549  WBC 21.4* 14.7* 16.5*  NEUTROABS 18.3*  --   --   HGB 14.0 10.6* 11.5*  HCT 40.2 30.7* 33.9*  MCV 81.2 82.7 84.1  PLT 279 199 215   Cardiac Enzymes: No results for input(s): "CKTOTAL", "CKMB", "CKMBINDEX", "TROPONINI" in the last 168 hours. BNP (last 3 results) No results for input(s): "PROBNP" in the last 8760 hours. CBG: Recent Labs  Lab  11/09/23 1612 11/09/23 1816 11/09/23 1958 11/10/23 0829 11/10/23 1120  GLUCAP 222* 220* 247* 188* 190*   D-Dimer: No results for input(s): "DDIMER" in the last 72 hours. Hgb A1c: Recent Labs    11/09/23 1945  HGBA1C 10.6*   Lipid Profile: No results for input(s): "CHOL", "HDL", "LDLCALC", "TRIG", "CHOLHDL", "LDLDIRECT" in the last 72 hours. Thyroid function studies: No results for input(s): "TSH", "T4TOTAL", "T3FREE", "THYROIDAB" in the last 72 hours.  Invalid input(s): "FREET3" Anemia work up: No results for input(s): "VITAMINB12", "FOLATE", "FERRITIN", "TIBC", "IRON", "RETICCTPCT" in the last 72 hours. Sepsis Labs: Recent Labs  Lab 11/09/23 1203 11/09/23 1220 11/09/23 1405 11/09/23 1945 11/10/23 0549  WBC 21.4*  --   --  14.7* 16.5*  LATICACIDVEN  --  3.1* 1.8  --   --     Microbiology Recent Results (from the past 240 hour(s))  Chlamydia/NGC rt PCR ([facility] only)     Status: None   Collection Time: 11/09/23 10:01 AM   Specimen: Urine; GU  Result Value Ref Range Status   Specimen source GC/Chlam URINE, RANDOM  Final   Chlamydia Tr NOT DETECTED NOT DETECTED Final   N gonorrhoeae NOT DETECTED NOT DETECTED Final    Comment: (NOTE) This CT/NG assay has not been evaluated in patients with a history of  hysterectomy. Performed at [facility], [address] [place], [place] [address]   Culture, blood (single)     Status: None (Preliminary result)   Collection Time: 11/09/23  2:05 PM   Specimen: BLOOD RIGHT ARM  Result Value Ref Range Status   Specimen Description BLOOD RIGHT ARM  Final   Special Requests   Final    BOTTLES DRAWN AEROBIC AND ANAEROBIC Blood Culture adequate volume   Culture  Setup Time IN BOTH AEROBIC AND ANAEROBIC BOTTLES  Final   Culture   Final    NO ORGANISMS SEEN Performed at [facility], [address], [place], [place] [address]    Report Status PENDING  Incomplete  Aerobic/Anaerobic Culture w Gram Stain  (surgical/deep wound)     Status: None (Preliminary result)   Collection Time: 11/09/23  5:07 PM   Specimen: Path Tissue  Result Value Ref Range Status   Specimen Description   Final    TISSUE Performed at [facility], [address], [place], [place] [address]    Special Requests SCROTAL TISSUE SPEC A  Final   Gram Stain   Final    RARE WBC PRESENT, PREDOMINANTLY MONONUCLEAR RARE GRAM POSITIVE COCCI IN PAIRS Performed at [facility], [address], [place], [place] [address]    Culture PENDING  Incomplete   Report Status PENDING  Incomplete    Procedures and diagnostic studies:  CT ABDOMEN PELVIS W CONTRAST  Result Date: 11/09/2023 CLINICAL DATA:  Fournier's gangrene.  Left scrotal swelling. EXAM: CT ABDOMEN AND PELVIS WITH CONTRAST TECHNIQUE: Multidetector CT imaging of the abdomen and pelvis was performed using the standard protocol following bolus administration of intravenous contrast. RADIATION DOSE REDUCTION: This exam was performed according to the departmental dose-optimization program which includes automated exposure control, adjustment of the mA and/or kV according to patient size and/or use of iterative reconstruction technique. CONTRAST:  OMNIPAQUE IOHEXOL 300 MG/ML  SOLN COMPARISON:  Ultrasound scrotum and Doppler exam from earlier the same day. FINDINGS: Lower chest: There is a 6.3 x 8.2 mm solid noncalcified nodule in the right lung lower lobe abutting the pleura. Please see follow-up recommendations below. The lung bases are otherwise clear. No pleural effusion. The heart is normal in size. No pericardial effusion. Hepatobiliary: The liver is normal in size. Non-cirrhotic configuration. No suspicious mass. There is a geographic, small, hypoattenuating area along the falciform  ligament attachment site, which even though incompletely characterized on the current examination, favored to represent focal fatty infiltration. Attention on follow-up  examination is recommended. No intrahepatic or extrahepatic bile duct dilation. Small volume dependent gallstones/sludge noted without imaging signs of acute cholecystitis. Normal gallbladder wall thickness. No pericholecystic inflammatory changes. Pancreas: Unremarkable. No pancreatic ductal dilatation or surrounding inflammatory changes. Spleen: Within normal limits. No focal lesion. Adrenals/Urinary Tract: Adrenal glands are unremarkable. No suspicious renal mass. No hydronephrosis. No renal or ureteric calculi. Unremarkable urinary bladder. Stomach/Bowel: No disproportionate dilation of the small or large bowel loops. No evidence of abnormal bowel wall thickening or inflammatory changes. The appendix is unremarkable. Vascular/Lymphatic: No ascites or pneumoperitoneum. No abdominal or pelvic lymphadenopathy, by size criteria. No aneurysmal dilation of the major abdominal arteries. Reproductive: Normal-sized prostate gland. Symmetric bilateral seminal vesicles. Along the left side of the scrotum, there is diffuse hypoattenuating area. The internal attenuation of the area is 20-22 Hounsfield units. There is no discrete peripheral wall. There is small amount of such area along the superior anterior aspect of the scrotum also, surrounding both testicles. No evidence of air within the soft tissue. Findings may represent diffuse scrotal wall edema/cellulitis without frank abscess. Other: There is a tiny fat containing umbilical hernia. The soft tissues and abdominal wall are otherwise unremarkable. Musculoskeletal: No suspicious osseous lesions. There are mild multilevel degenerative changes in the visualized spine. IMPRESSION: *There are low-attenuation areas predominantly along the left side of the scrotum which also extends surrounding both testicles. No evidence of frank walled-off abscess or air within the soft tissue. Findings may represent diffuse scrotal wall edema/cellulitis without frank abscess. *Multiple  other nonacute observations, as described above. Electronically Signed   By: Jules Schick M.D.   On: 11/09/2023 16:54   US SCROTUM W/DOPPLER  Result Date: 11/09/2023 CLINICAL DATA:  [id number] Swelling of left half of scrotum [id number] EXAM: SCROTAL ULTRASOUND DOPPLER ULTRASOUND OF THE TESTICLES TECHNIQUE: Complete ultrasound examination of the testicles, epididymis, and other scrotal structures was performed. Color and spectral Doppler ultrasound were also utilized to evaluate blood flow to the testicles. COMPARISON:  None Available. FINDINGS: Right testicle Measurements: 2.0 x 2.9 x 4.8 cm. No mass or microlithiasis visualized. Left testicle Measurements: 2.0 x 2.8 x 4.4 cm. No mass or microlithiasis visualized. Right epididymis:  Normal in size and appearance. Left epididymis:  Normal in size and appearance. Hydrocele:  None visualized. Varicocele:  None visualized. Pulsed Doppler interrogation of both testes demonstrates normal low resistance arterial and venous waveforms bilaterally. There is diffuse asymmetric thickening along the left scrotal wall, as seen on the provided images. There are heterogeneous hypoechoic areas; however, no discrete drainable abscess or collection. IMPRESSION: *There is diffuse asymmetric thickening along the left scrotal wall, as seen on the provided images. There are heterogeneous hypoechoic areas; however, no discrete drainable abscess or collection. Findings may represent subcutaneous edema. Correlate clinically. *Otherwise unremarkable exam. Electronically Signed   By: Jules Schick M.D.   On: 11/09/2023 15:21               LOS: 1 day   Helma Argyle  [facility]   Pager on [email or web address]. If 7PM-7AM, please contact night-coverage at [email or web address]     11/10/2023, 4:32 PM

## 2023-11-10 NOTE — TOC Initial Note (Signed)
Transition of Care [facility]) - Initial/Assessment Note    Patient Details  Name: Grant Curry MRN: [id number] Date of Birth: 10-25-1996  Transition of Care [facility]) CM/SW Contact:    Chapman Fitch, RN Phone Number: 11/10/2023, 4:26 PM  Clinical Narrative:                   Admitted for: Scrotal and perineal debridement of Fournier's gangrene  Admitted from: Home with mother and father PCP: No PCP. Uninsured  Pharmacy: NA Current home health/prior home health/DME: NA  Patient defers information to be relayed to his parents  Call placed to Mother Dorene Grebe, Father Caryn Bee was with Dorene Grebe during our discuss.   Patient lives at home with parents  Mother states that MD informed her she would need to apply for emergency Medicaid.  I emailed [place] with [employer] to screen patient. I also informed mother that she could reach out to DSS  Discussed option for primary care as patient is uninsured.  Provided information on [facility]  and [facility].  Mother would like to proceed with [facility].  The earliest appointment at their clinics was at [facility] 01/04/24 at 920. Information added to AVS  [facility] department does not have any charity glucometer kits  Attending, urology, and pharmacist updated and notified that patient would need to obtain discharge medications as well as glucometer supplies at [facility] pharmacy   Patient will likely have wound care at discharge.  Mother confirms she will be able to assist at discharge. Per urology discussions with plastics to determine if it is an option to close patient         Patient Goals and CMS Choice            Expected Discharge Plan and Services                                              Prior Living Arrangements/Services                       Activities of Daily Living   ADL Screening (condition at time of admission) Independently performs ADLs?: Yes (appropriate for  developmental age) Is the patient deaf or have difficulty hearing?: No Does the patient have difficulty seeing, even when wearing glasses/contacts?: No Does the patient have difficulty concentrating, remembering, or making decisions?: No  Permission Sought/Granted                  Emotional Assessment              Admission diagnosis:  Hyponatremia [E87.1] Fournier gangrene [N49.3] Medically noncompliant [Z91.199] Fournier's gangrene of scrotum [N49.3] Hyperglycemia due to diabetes mellitus (HCC) [E11.65] Patient Active Problem List   Diagnosis Date Noted   Fournier gangrene 11/09/2023   Sepsis (HCC) 11/09/2023   Hyperglycemia 11/09/2023   Tobacco abuse 11/09/2023   PCP:  Pcp, No Pharmacy:   [facility] [address] [facility] - [place], [place] - [address] [facility] [address] [place] [address] Phone: [phone] Fax: [phone]  [facility] [address] [place] [address] Phone: [phone] Fax: [phone]     Social Determinants of Health (SDOH) Social History: SDOH Screenings   Tobacco Use: Low Risk  (11/10/2023)   SDOH Interventions:  Readmission Risk Interventions     No data to display

## 2023-11-10 NOTE — Progress Notes (Signed)
Report given to Suzan RN at [facility] reflecting patient's current status. Patient family notified

## 2023-11-10 NOTE — Inpatient Diabetes Management (Addendum)
Inpatient Diabetes Program Recommendations  AACE/ADA: New Consensus Statement on Inpatient Glycemic Control   Target Ranges:  Prepandial:   less than 140 mg/dL      Peak postprandial:   less than 180 mg/dL (1-2 hours)      Critically ill patients:  140 - 180 mg/dL    Latest Reference Range & Units 11/10/23 08:29  Glucose-Capillary 70 - 99 mg/dL 161 (H)    Latest Reference Range & Units 11/09/23 15:18 11/09/23 16:12 11/09/23 18:16 11/09/23 19:58  Glucose-Capillary 70 - 99 mg/dL 096 (H) 045 (H) 409 (H) 247 (H)    Latest Reference Range & Units 11/09/23 12:03  CO2 22 - 32 mmol/L 23  Glucose 70 - 99 mg/dL 811 (H)  Anion gap 5 - 15  14    Latest Reference Range & Units 11/09/23 19:45  Hemoglobin A1C 4.8 - 5.6 % 10.6 (H)   Review of Glycemic Control  Diabetes history: DM2 Outpatient Diabetes medications: None Current orders for Inpatient glycemic control: Novolog 0-15 units TID with meals, Novolog 0-5 units at bedtime, Novolog 4 units TID with meals  Inpatient Diabetes Program Recommendations:    Insulin: Please consider ordering Semglee 5 units Q24H.  HbgA1C: A1C 10.6% on 11/09/23 indicating an average glucose of 258 mg/dl over the past 2-3 months.  NOTE: Patient admitted fournier gangrene, sepsis, and hyperglycemia. Initial glucose 434 mg/dl. Per note by Bridget Hartshorn, PA on 11/09/23, "He currently does not have a PCP and family states that he generally goes to an urgent care rarely as he normally does not have any medical problems.  Father states that he has not taken any medication for diabetes because he refuses and has probably been 10 years since any medications have been taken.  At that time he was on metformin." Ordered Living Well with Diabetes book, RD consult for diet education, and TOC consult to assist with follow up, medication assistance, and asked to provide glucose monitoring kit. Will plan to speak with patient today. Addendum 11/10/23@11 :30-Went by to see patient but  currently off unit in OR. Will plan to follow up with patient tomorrow, 11/11/23.  Thanks, Orlando Penner, RN, MSN, CDCES Diabetes Coordinator Inpatient Diabetes Program [phone] (Team Pager from 8am to 5pm)

## 2023-11-10 NOTE — Anesthesia Preprocedure Evaluation (Addendum)
Anesthesia Evaluation  Patient identified by MRN, date of birth, ID band Patient awake    Reviewed: Allergy & Precautions, NPO status , Patient's Chart, lab work & pertinent test results  History of Anesthesia Complications Negative for: history of anesthetic complications  Airway Mallampati: I   Neck ROM: Full    Dental  (+) Poor Dentition   Pulmonary Current Smoker (1 ppd) and Patient abstained from smoking.   Pulmonary exam normal breath sounds clear to auscultation       Cardiovascular hypertension, Normal cardiovascular exam Rhythm:Regular Rate:Normal     Neuro/Psych negative neurological ROS     GI/Hepatic negative GI ROS,,,  Endo/Other  diabetes, Type 2    Renal/GU negative Renal ROS     Musculoskeletal   Abdominal   Peds  Hematology negative hematology ROS (+)   Anesthesia Other Findings   Reproductive/Obstetrics                             Anesthesia Physical Anesthesia Plan  ASA: 2  Anesthesia Plan: General   Post-op Pain Management:    Induction: Intravenous  PONV Risk Score and Plan: 1 and Ondansetron, Dexamethasone and Treatment may vary due to age or medical condition  Airway Management Planned: LMA  Additional Equipment:   Intra-op Plan:   Post-operative Plan: Extubation in OR  Informed Consent: I have reviewed the patients History and Physical, chart, labs and discussed the procedure including the risks, benefits and alternatives for the proposed anesthesia with the patient or authorized representative who has indicated his/her understanding and acceptance.     Dental advisory given  Plan Discussed with: CRNA  Anesthesia Plan Comments: (Patient consented for risks of anesthesia including but not limited to:  - adverse reactions to medications - damage to eyes, teeth, lips or other oral mucosa - nerve damage due to positioning  - sore throat or  hoarseness - damage to heart, brain, nerves, lungs, other parts of body or loss of life  Informed patient about role of CRNA in peri- and intra-operative care.  Patient voiced understanding.)       Anesthesia Quick Evaluation

## 2023-11-10 NOTE — Plan of Care (Signed)
  Problem: Education: Goal: Ability to describe self-care measures that may prevent or decrease complications (Diabetes Survival Skills Education) will improve Outcome: Progressing   Problem: Skin Integrity: Goal: Risk for impaired skin integrity will decrease Outcome: Progressing   Problem: Activity: Goal: Risk for activity intolerance will decrease Outcome: Progressing   Problem: Nutrition: Goal: Adequate nutrition will be maintained Outcome: Progressing   Problem: Pain Management: Goal: General experience of comfort will improve Outcome: Progressing

## 2023-11-10 NOTE — H&P (Addendum)
History and Physical    [name] VWU:[id number] DOB: 1996-11-23 DOA: 11/10/2023  PCP: Pcp, No  Patient coming from: [facility]  I have personally briefly reviewed patient's old medical records in [facility]  Chief Complaint: Fournier Gangrene s/p debridement requiring expedited plastic surgery consult for primary closure.  HPI: Grant Curry is a 27 y.o. male with medical history significant of  hypertension, diabetes mellitus, tobacco use disorder, who presented to [facility] with complaint of groin pain and scrotal swelling on 11/09/2023. ON evaluation he was found to have sepsis secondary to Fournier's gangrene.  He was treated with analgesia, empiric IV antibiotics( linezolid and zosyn)  as well as  surgical debridement  of scrotum and perineal area. Patient s/p treatment noted to have improvement.  Patient initially slated to be discharged home with outpatient f/u with plastic surgery. However it was deemed more beneficial for patient to have inpatient plastic surgery consult for primary closure of surgical scrotal wound. Due to this development patient was transferred to  [facility] for expedited plastic surgery consultation.  Patient currently notes no fever/chills/ n/v/d/ or abdominal pain, he notes perineal pain in 3-4 out of ten s/p travel.   Review of Systems: As per HPI otherwise 10 point review of systems negative.   Past Medical History:  Diagnosis Date   Diabetes mellitus without complication (HCC)    Hypertension     Past Surgical History:  Procedure Laterality Date   SCROTAL EXPLORATION Bilateral 11/09/2023   Procedure: SCROTUM AND PARTIAL PERINEAL EXPLORATION AND DEBRIDEMENT FOR FOURNIER'S GANGRENE;  Surgeon: Vanna Scotland, MD;  Location: [facility] ORS;  Service: Urology;  Laterality: Bilateral;   WOUND DEBRIDEMENT  11/09/2023   Procedure: DEBRIDEMENT PERINEAL WOUND;  Surgeon: Sung Amabile, DO;  Location: [facility] ORS;  Service: General;;     reports that he has never smoked. He  has never used smokeless tobacco. He reports that he does not drink alcohol and does not use drugs.  No Known Allergies  No family history on file.  Prior to Admission medications   Medication Sig Start Date End Date Taking? Authorizing Provider  insulin glargine-yfgn (SEMGLEE) 100 UNIT/ML injection Inject 0.08 mLs (8 Units total) into the skin daily. Patient not taking: Reported on 11/10/2023 11/10/23   Lurene Shadow, MD  linezolid (ZYVOX) 600 MG/300ML IVPB Inject 300 mLs (600 mg total) into the vein every 12 (twelve) hours. Patient not taking: Reported on 11/10/2023 11/10/23   Lurene Shadow, MD  piperacillin-tazobactam (ZOSYN) 3.375 GM/50ML IVPB Inject 50 mLs (3.375 g total) into the vein every 8 (eight) hours. Patient not taking: Reported on 11/10/2023 11/10/23   Lurene Shadow, MD    Physical Exam: There were no vitals filed for this visit.  Constitutional: NAD, calm, comfortable There were no vitals filed for this visit. Eyes: PERRL, lids and conjunctivae normal ENMT: Mucous membranes are moist. Posterior pharynx clear of any exudate or lesions.Normal dentition.  Neck: normal, supple, no masses, no thyromegaly Respiratory: clear to auscultation bilaterally, no wheezing, no crackles. Normal respiratory effort. No accessory muscle use.  Cardiovascular: Regular rate and rhythm, no murmurs / rubs / gallops. No extremity edema. 2+ pedal pulses Abdomen: no tenderness, no masses palpated. No hepatosplenomegaly. Bowel sounds positive.  Musculoskeletal: no clubbing / cyanosis. No joint deformity upper and lower extremities. Good ROM, no contractures. Normal muscle tone.  Skin: area of debridement note in perineum ,dressing dry no oozing noted GU: scrotum mild erythema and mild post surgical swelling  Neurologic: CN 2-12 grossly intact. Sensation  intact, DTR normal. Strength 5/5 in all 4.  Psychiatric: Normal judgment and insight. Alert and oriented x 3. Normal mood.    Labs on Admission: I  have personally reviewed following labs and imaging studies  CBC: Recent Labs  Lab 11/09/23 1203 11/09/23 1945 11/10/23 0549  WBC 21.4* 14.7* 16.5*  NEUTROABS 18.3*  --   --   HGB 14.0 10.6* 11.5*  HCT 40.2 30.7* 33.9*  MCV 81.2 82.7 84.1  PLT 279 199 215   Basic Metabolic Panel: Recent Labs  Lab 11/09/23 1203 11/09/23 1945 11/10/23 0549  NA 127*  --  135  K 4.0  --  3.9  CL 90*  --  101  CO2 23  --  23  GLUCOSE 434*  --  210*  BUN 18  --  17  CREATININE 1.05 0.84 1.11  CALCIUM 9.0  --  7.5*   GFR: Estimated Creatinine Clearance: 108.7 mL/min (by C-G formula based on SCr of 1.11 mg/dL). Liver Function Tests: Recent Labs  Lab 11/09/23 1203 11/10/23 0549  AST 19 18  ALT 28 25  ALKPHOS 101 70  BILITOT 1.7* 1.1  PROT 8.1 5.9*  ALBUMIN 3.8 2.7*   No results for input(s): "LIPASE", "AMYLASE" in the last 168 hours. No results for input(s): "AMMONIA" in the last 168 hours. Coagulation Profile: No results for input(s): "INR", "PROTIME" in the last 168 hours. Cardiac Enzymes: No results for input(s): "CKTOTAL", "CKMB", "CKMBINDEX", "TROPONINI" in the last 168 hours. BNP (last 3 results) No results for input(s): "PROBNP" in the last 8760 hours. HbA1C: Recent Labs    11/09/23 1945  HGBA1C 10.6*   CBG: Recent Labs  Lab 11/09/23 1816 11/09/23 1958 11/10/23 0829 11/10/23 1120 11/10/23 1754  GLUCAP 220* 247* 188* 190* 330*   Lipid Profile: No results for input(s): "CHOL", "HDL", "LDLCALC", "TRIG", "CHOLHDL", "LDLDIRECT" in the last 72 hours. Thyroid Function Tests: No results for input(s): "TSH", "T4TOTAL", "FREET4", "T3FREE", "THYROIDAB" in the last 72 hours. Anemia Panel: No results for input(s): "VITAMINB12", "FOLATE", "FERRITIN", "TIBC", "IRON", "RETICCTPCT" in the last 72 hours. Urine analysis:    Component Value Date/Time   COLORURINE YELLOW (A) 11/09/2023 1001   APPEARANCEUR CLEAR (A) 11/09/2023 1001   LABSPEC 1.030 11/09/2023 1001   PHURINE 5.0  11/09/2023 1001   GLUCOSEU >=500 (A) 11/09/2023 1001   HGBUR SMALL (A) 11/09/2023 1001   BILIRUBINUR NEGATIVE 11/09/2023 1001   KETONESUR 20 (A) 11/09/2023 1001   PROTEINUR 30 (A) 11/09/2023 1001   NITRITE NEGATIVE 11/09/2023 1001   LEUKOCYTESUR NEGATIVE 11/09/2023 1001    Radiological Exams on Admission: CT ABDOMEN PELVIS W CONTRAST  Result Date: 11/09/2023 CLINICAL DATA:  Fournier's gangrene.  Left scrotal swelling. EXAM: CT ABDOMEN AND PELVIS WITH CONTRAST TECHNIQUE: Multidetector CT imaging of the abdomen and pelvis was performed using the standard protocol following bolus administration of intravenous contrast. RADIATION DOSE REDUCTION: This exam was performed according to the departmental dose-optimization program which includes automated exposure control, adjustment of the mA and/or kV according to patient size and/or use of iterative reconstruction technique. CONTRAST:  OMNIPAQUE IOHEXOL 300 MG/ML  SOLN COMPARISON:  Ultrasound scrotum and Doppler exam from earlier the same day. FINDINGS: Lower chest: There is a 6.3 x 8.2 mm solid noncalcified nodule in the right lung lower lobe abutting the pleura. Please see follow-up recommendations below. The lung bases are otherwise clear. No pleural effusion. The heart is normal in size. No pericardial effusion. Hepatobiliary: The liver is normal in size. Non-cirrhotic configuration.  No suspicious mass. There is a geographic, small, hypoattenuating area along the falciform ligament attachment site, which even though incompletely characterized on the current examination, favored to represent focal fatty infiltration. Attention on follow-up examination is recommended. No intrahepatic or extrahepatic bile duct dilation. Small volume dependent gallstones/sludge noted without imaging signs of acute cholecystitis. Normal gallbladder wall thickness. No pericholecystic inflammatory changes. Pancreas: Unremarkable. No pancreatic ductal dilatation or  surrounding inflammatory changes. Spleen: Within normal limits. No focal lesion. Adrenals/Urinary Tract: Adrenal glands are unremarkable. No suspicious renal mass. No hydronephrosis. No renal or ureteric calculi. Unremarkable urinary bladder. Stomach/Bowel: No disproportionate dilation of the small or large bowel loops. No evidence of abnormal bowel wall thickening or inflammatory changes. The appendix is unremarkable. Vascular/Lymphatic: No ascites or pneumoperitoneum. No abdominal or pelvic lymphadenopathy, by size criteria. No aneurysmal dilation of the major abdominal arteries. Reproductive: Normal-sized prostate gland. Symmetric bilateral seminal vesicles. Along the left side of the scrotum, there is diffuse hypoattenuating area. The internal attenuation of the area is 20-22 Hounsfield units. There is no discrete peripheral wall. There is small amount of such area along the superior anterior aspect of the scrotum also, surrounding both testicles. No evidence of air within the soft tissue. Findings may represent diffuse scrotal wall edema/cellulitis without frank abscess. Other: There is a tiny fat containing umbilical hernia. The soft tissues and abdominal wall are otherwise unremarkable. Musculoskeletal: No suspicious osseous lesions. There are mild multilevel degenerative changes in the visualized spine. IMPRESSION: *There are low-attenuation areas predominantly along the left side of the scrotum which also extends surrounding both testicles. No evidence of frank walled-off abscess or air within the soft tissue. Findings may represent diffuse scrotal wall edema/cellulitis without frank abscess. *Multiple other nonacute observations, as described above. Electronically Signed   By: Jules Schick M.D.   On: 11/09/2023 16:54   US SCROTUM W/DOPPLER  Result Date: 11/09/2023 CLINICAL DATA:  [id number] Swelling of left half of scrotum [id number] EXAM: SCROTAL ULTRASOUND DOPPLER ULTRASOUND OF THE TESTICLES TECHNIQUE:  Complete ultrasound examination of the testicles, epididymis, and other scrotal structures was performed. Color and spectral Doppler ultrasound were also utilized to evaluate blood flow to the testicles. COMPARISON:  None Available. FINDINGS: Right testicle Measurements: 2.0 x 2.9 x 4.8 cm. No mass or microlithiasis visualized. Left testicle Measurements: 2.0 x 2.8 x 4.4 cm. No mass or microlithiasis visualized. Right epididymis:  Normal in size and appearance. Left epididymis:  Normal in size and appearance. Hydrocele:  None visualized. Varicocele:  None visualized. Pulsed Doppler interrogation of both testes demonstrates normal low resistance arterial and venous waveforms bilaterally. There is diffuse asymmetric thickening along the left scrotal wall, as seen on the provided images. There are heterogeneous hypoechoic areas; however, no discrete drainable abscess or collection. IMPRESSION: *There is diffuse asymmetric thickening along the left scrotal wall, as seen on the provided images. There are heterogeneous hypoechoic areas; however, no discrete drainable abscess or collection. Findings may represent subcutaneous edema. Correlate clinically. *Otherwise unremarkable exam. Electronically Signed   By: Jules Schick M.D.   On: 11/09/2023 15:21    EKG: Independently reviewed.   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   Fournier Gangrene   -s/p debridement , now with need for plastic surgery evaluation for primary closure  - plastic surgery consult placed  - continue with iv zyvox and zosyn  -monitor for any worsening signs of progressive infection   DMII -iss/fs  -continue on long acting insulin -Semglee 8 units daily, NovoLog  4 units 3 times daily with meals and NovoLog as needed for hyperglycemia    Tobacco abuse -encourage cessation  -s/p counseling   Hypertension - bp stable of medications    DVT prophylaxis: heparin Code Status: full/ as discussed per  patient wishes in event of cardiac arrest Family Communication: none at bedside Disposition Plan: patient  expected to be admitted greater than 2 midnights  Consults called: plastic surgery Dr. Ulice Bold  Admission status: med tele   Lurline Del MD [facility]   If 7PM-7AM, please contact night-coverage [email or web address] Password [facility]     11/10/2023, 8:32 PM

## 2023-11-11 ENCOUNTER — Encounter: Payer: Self-pay | Admitting: Urology

## 2023-11-11 DIAGNOSIS — E1165 Type 2 diabetes mellitus with hyperglycemia: Secondary | ICD-10-CM | POA: Insufficient documentation

## 2023-11-11 DIAGNOSIS — E119 Type 2 diabetes mellitus without complications: Secondary | ICD-10-CM | POA: Insufficient documentation

## 2023-11-11 DIAGNOSIS — E785 Hyperlipidemia, unspecified: Secondary | ICD-10-CM | POA: Insufficient documentation

## 2023-11-11 LAB — GLUCOSE, CAPILLARY
Glucose-Capillary: 242 mg/dL — ABNORMAL HIGH (ref 70–99)
Glucose-Capillary: 215 mg/dL — ABNORMAL HIGH (ref 70–99)
Glucose-Capillary: 263 mg/dL — ABNORMAL HIGH (ref 70–99)
Glucose-Capillary: 217 mg/dL — ABNORMAL HIGH (ref 70–99)

## 2023-11-11 LAB — COMPREHENSIVE METABOLIC PANEL
ALT: 27 U/L (ref 0–44)
AST: 16 U/L (ref 15–41)
Albumin: 2.5 g/dL — ABNORMAL LOW (ref 3.5–5.0)
Alkaline Phosphatase: 80 U/L (ref 38–126)
Anion gap: 10 (ref 5–15)
BUN: 17 mg/dL (ref 6–20)
CO2: 23 mmol/L (ref 22–32)
Calcium: 7.9 mg/dL — ABNORMAL LOW (ref 8.9–10.3)
Chloride: 102 mmol/L (ref 98–111)
Creatinine, Ser: 0.99 mg/dL (ref 0.61–1.24)
GFR, Estimated: >60 mL/min (ref >60)
Glucose, Bld: 232 mg/dL — ABNORMAL HIGH (ref 70–99)
Potassium: 3.6 mmol/L (ref 3.5–5.1)
Sodium: 135 mmol/L (ref 135–145)
Total Bilirubin: 1.1 mg/dL (ref <1.2)
Total Protein: 5.8 g/dL — ABNORMAL LOW (ref 6.5–8.1)

## 2023-11-11 LAB — CBC
HCT: 31.2 % — ABNORMAL LOW (ref 39.0–52.0)
Hemoglobin: 10.7 g/dL — ABNORMAL LOW (ref 13.0–17.0)
MCH: 28.6 pg (ref 26.0–34.0)
MCHC: 34.3 g/dL (ref 30.0–36.0)
MCV: 83.4 fL (ref 80.0–100.0)
Platelets: 233 10*3/uL (ref 150–400)
RBC: 3.74 MIL/uL — ABNORMAL LOW (ref 4.22–5.81)
RDW: 12.9 % (ref 11.5–15.5)
WBC: 13 10*3/uL — ABNORMAL HIGH (ref 4.0–10.5)
nRBC: 0 % (ref 0.0–0.2)

## 2023-11-11 LAB — SURGICAL PATHOLOGY

## 2023-11-11 MED ORDER — INSULIN ASPART 100 UNIT/ML IJ SOLN
0.0000 [IU] | Freq: Three times a day (TID) | INTRAMUSCULAR | Status: DC
  Administered 2023-11-11: 5 [IU] via SUBCUTANEOUS
  Administered 2023-11-11 – 2023-11-13 (×5): 3 [IU] via SUBCUTANEOUS
  Administered 2023-11-13: 2 [IU] via SUBCUTANEOUS
  Administered 2023-11-13: 1 [IU] via SUBCUTANEOUS
  Administered 2023-11-14: 2 [IU] via SUBCUTANEOUS
  Administered 2023-11-15 (×2): 3 [IU] via SUBCUTANEOUS

## 2023-11-11 MED ORDER — INSULIN GLARGINE-YFGN 100 UNIT/ML ~~LOC~~ SOLN
12.0000 [IU] | Freq: Every day | SUBCUTANEOUS | Status: DC
  Administered 2023-11-11 – 2023-11-15 (×4): 12 [IU] via SUBCUTANEOUS
  Filled 2023-11-11 (×5): qty 0.12

## 2023-11-11 MED ORDER — CHLORHEXIDINE GLUCONATE CLOTH 2 % EX PADS
6.0000 | MEDICATED_PAD | Freq: Every day | CUTANEOUS | Status: DC
  Administered 2023-11-12 – 2023-11-15 (×4): 6 via TOPICAL

## 2023-11-11 NOTE — Progress Notes (Cosign Needed Addendum)
Reason for Consult: Scrotal wound Referring Physician: Dr. Gwenlyn Found Grant Curry is an 27 y.o. male.  HPI: Patient is a pleasant 27 year old male with history of poorly controlled T2DM and tobacco use disorder admitted to [facility] 11/09/2023 for sepsis secondary to Fournier's gangrene s/p urologic scrotal/perineal debridement x 2.  Patient was transferred to [facility] for inpatient plastic surgery consult and consideration for primary closure.  On examination, patient is resting comfortably and accompanied by parents at bedside.  Pain relatively well-controlled.  Last dressing change was performed yesterday in the operating room.  He tells me that he has not taken any medication for his T2DM in years.  He thought that initially he had a groin strain and waited days before the pain provoked him to go to the ED for evaluation.  Past Medical History:  Diagnosis Date   Diabetes mellitus without complication (HCC)    Hypertension     Past Surgical History:  Procedure Laterality Date   SCROTAL EXPLORATION Bilateral 11/09/2023   Procedure: SCROTUM AND PARTIAL PERINEAL EXPLORATION AND DEBRIDEMENT FOR FOURNIER'S GANGRENE;  Surgeon: Vanna Scotland, MD;  Location: [facility] ORS;  Service: Urology;  Laterality: Bilateral;   SCROTAL EXPLORATION Bilateral 11/10/2023   Procedure: SCROTUM EXPLORATION;  Surgeon: Vanna Scotland, MD;  Location: [facility] ORS;  Service: Urology;  Laterality: Bilateral;   WOUND DEBRIDEMENT  11/09/2023   Procedure: DEBRIDEMENT PERINEAL WOUND;  Surgeon: Sung Amabile, DO;  Location: [facility] ORS;  Service: General;;    No family history on file.  Social History:  reports that he has never smoked. He has never used smokeless tobacco. He reports that he does not drink alcohol and does not use drugs.  Allergies: No Known Allergies  Medications: I have reviewed the patient's current medications.  Results for orders placed or performed during the hospital encounter of 11/10/23 (from the past 48  hour(s))  CBC     Status: Abnormal   Collection Time: 11/10/23  9:21 PM  Result Value Ref Range   WBC 13.9 (H) 4.0 - 10.5 K/uL   RBC 4.11 (L) 4.22 - 5.81 MIL/uL   Hemoglobin 11.7 (L) 13.0 - 17.0 g/dL   HCT 09.8 (L) 11.9 - 14.7 %   MCV 83.5 80.0 - 100.0 fL   MCH 28.5 26.0 - 34.0 pg   MCHC 34.1 30.0 - 36.0 g/dL   RDW 82.9 56.2 - 13.0 %   Platelets 254 150 - 400 K/uL   nRBC 0.0 0.0 - 0.2 %    Comment: Performed at [facility], [address] [place], [place] [address]  Creatinine, serum     Status: None   Collection Time: 11/10/23  9:21 PM  Result Value Ref Range   Creatinine, Ser 1.14 0.61 - 1.24 mg/dL   GFR, Estimated >46 >96 mL/min    Comment: (NOTE) Calculated using the CKD-EPI Creatinine Equation (2021) Performed at [facility], [address], [place], [place] [address]   Glucose, capillary     Status: Abnormal   Collection Time: 11/10/23  9:27 PM  Result Value Ref Range   Glucose-Capillary 290 (H) 70 - 99 mg/dL    Comment: Glucose reference range applies only to samples taken after fasting for at least 8 hours.  CBC     Status: Abnormal   Collection Time: 11/11/23  6:08 AM  Result Value Ref Range   WBC 13.0 (H) 4.0 - 10.5 K/uL   RBC 3.74 (L) 4.22 - 5.81 MIL/uL   Hemoglobin 10.7 (L)  13.0 - 17.0 g/dL   HCT 16.1 (L) 09.6 - 04.5 %   MCV 83.4 80.0 - 100.0 fL   MCH 28.6 26.0 - 34.0 pg   MCHC 34.3 30.0 - 36.0 g/dL   RDW 40.9 81.1 - 91.4 %   Platelets 233 150 - 400 K/uL   nRBC 0.0 0.0 - 0.2 %    Comment: Performed at [facility], [address] [place], [place] [address]  Comprehensive metabolic panel     Status: Abnormal   Collection Time: 11/11/23  6:08 AM  Result Value Ref Range   Sodium 135 135 - 145 mmol/L   Potassium 3.6 3.5 - 5.1 mmol/L   Chloride 102 98 - 111 mmol/L   CO2 23 22 - 32 mmol/L   Glucose, Bld 232 (H) 70 - 99 mg/dL    Comment: Glucose reference range applies only to samples taken after fasting for at least 8 hours.   BUN 17 6  - 20 mg/dL   Creatinine, Ser 6.21 0.61 - 1.24 mg/dL   Calcium 7.9 (L) 8.9 - 10.3 mg/dL   Total Protein 5.8 (L) 6.5 - 8.1 g/dL   Albumin 2.5 (L) 3.5 - 5.0 g/dL   AST 16 15 - 41 U/L   ALT 27 0 - 44 U/L   Alkaline Phosphatase 80 38 - 126 U/L   Total Bilirubin 1.1 <1.2 mg/dL   GFR, Estimated >30 >86 mL/min    Comment: (NOTE) Calculated using the CKD-EPI Creatinine Equation (2021)    Anion gap 10 5 - 15    Comment: Performed at [facility], [address], [place], [place] [address]  Glucose, capillary     Status: Abnormal   Collection Time: 11/11/23  8:00 AM  Result Value Ref Range   Glucose-Capillary 242 (H) 70 - 99 mg/dL    Comment: Glucose reference range applies only to samples taken after fasting for at least 8 hours.  Glucose, capillary     Status: Abnormal   Collection Time: 11/11/23 12:20 PM  Result Value Ref Range   Glucose-Capillary 215 (H) 70 - 99 mg/dL    Comment: Glucose reference range applies only to samples taken after fasting for at least 8 hours.    No results found.  ROS Endorses mild groin discomfort.  Denies fevers.   Blood pressure (!) 129/91, pulse 79, temperature 98.7 F (37.1 C), resp. rate 16, weight 87.9 kg, SpO2 100%. Physical Exam Constitutional:      General: He is not in acute distress.    Appearance: Normal appearance. He is not toxic-appearing.  HENT:     Head: Normocephalic.  Eyes:     Extraocular Movements: Extraocular movements intact.     Pupils: Pupils are equal, round, and reactive to light.  Genitourinary:    Comments: Indwelling catheter in place.  Area of debridement his left hemiscrotum and inferiorly towards perineum.  Wound measures 13 x 6 x 2.5 cm.  Tracks superior to the left testicle as well as inferiorlaterally.  There is still some evidence of cutaneous necrosis that may benefit from debridement.  Wound itself appears relatively clean and without significant malodor, but with presence of slough and nonviable tissue.   See pictures Musculoskeletal:     Cervical back: Normal range of motion.  Skin:    Capillary Refill: Capillary refill takes less than 2 seconds.  Neurological:     General: No focal deficit present.     Mental Status: He is alert and  oriented to person, place, and time.         Assessment/Plan:  Scrotal wound subsequent to Fournier's gangrene:  There remains evidence of nonviable tissue and necrosis on wound examination.  Do not feel as though he is a candidate for primary closure at this time.  Instead, recommending continued enzymatic debridement with wet-to-dry dressings followed by ABD pad and netted underwear.  Patient may be a candidate for OR debridement and placement of tissue matrix for assistance with granulation in the next 7 to 10 days.  Will discuss case with Dr. Ulice Bold and continue to follow patient.  Scrotal wound care: Wet-to-dry dressing using Vashe soaked Kerlix followed by ABD pad and secured with netted underwear.  Change daily.  Evelena Leyden 11/11/2023, 3:43 PM

## 2023-11-11 NOTE — H&P (View-Only) (Signed)
Reason for Consult: Scrotal wound Referring Physician: Dr. Gwenlyn Found Mcclenaghan is an 27 y.o. male.  HPI: Patient is a pleasant 27 year old male with history of poorly controlled T2DM and tobacco use disorder admitted to [facility] 11/09/2023 for sepsis secondary to Fournier's gangrene s/p urologic scrotal/perineal debridement x 2.  Patient was transferred to [facility] for inpatient plastic surgery consult and consideration for primary closure.  On examination, patient is resting comfortably and accompanied by parents at bedside.  Pain relatively well-controlled.  Last dressing change was performed yesterday in the operating room.  He tells me that he has not taken any medication for his T2DM in years.  He thought that initially he had a groin strain and waited days before the pain provoked him to go to the ED for evaluation.  Past Medical History:  Diagnosis Date   Diabetes mellitus without complication (HCC)    Hypertension     Past Surgical History:  Procedure Laterality Date   SCROTAL EXPLORATION Bilateral 11/09/2023   Procedure: SCROTUM AND PARTIAL PERINEAL EXPLORATION AND DEBRIDEMENT FOR FOURNIER'S GANGRENE;  Surgeon: Vanna Scotland, MD;  Location: [facility] ORS;  Service: Urology;  Laterality: Bilateral;   SCROTAL EXPLORATION Bilateral 11/10/2023   Procedure: SCROTUM EXPLORATION;  Surgeon: Vanna Scotland, MD;  Location: [facility] ORS;  Service: Urology;  Laterality: Bilateral;   WOUND DEBRIDEMENT  11/09/2023   Procedure: DEBRIDEMENT PERINEAL WOUND;  Surgeon: Sung Amabile, DO;  Location: [facility] ORS;  Service: General;;    No family history on file.  Social History:  reports that he has never smoked. He has never used smokeless tobacco. He reports that he does not drink alcohol and does not use drugs.  Allergies: No Known Allergies  Medications: I have reviewed the patient's current medications.  Results for orders placed or performed during the hospital encounter of 11/10/23 (from the past 48  hour(s))  CBC     Status: Abnormal   Collection Time: 11/10/23  9:21 PM  Result Value Ref Range   WBC 13.9 (H) 4.0 - 10.5 K/uL   RBC 4.11 (L) 4.22 - 5.81 MIL/uL   Hemoglobin 11.7 (L) 13.0 - 17.0 g/dL   HCT 09.8 (L) 11.9 - 14.7 %   MCV 83.5 80.0 - 100.0 fL   MCH 28.5 26.0 - 34.0 pg   MCHC 34.1 30.0 - 36.0 g/dL   RDW 82.9 56.2 - 13.0 %   Platelets 254 150 - 400 K/uL   nRBC 0.0 0.0 - 0.2 %    Comment: Performed at [facility], [address] [place], [place] [address]  Creatinine, serum     Status: None   Collection Time: 11/10/23  9:21 PM  Result Value Ref Range   Creatinine, Ser 1.14 0.61 - 1.24 mg/dL   GFR, Estimated >46 >96 mL/min    Comment: (NOTE) Calculated using the CKD-EPI Creatinine Equation (2021) Performed at [facility], [address], [place], [place] [address]   Glucose, capillary     Status: Abnormal   Collection Time: 11/10/23  9:27 PM  Result Value Ref Range   Glucose-Capillary 290 (H) 70 - 99 mg/dL    Comment: Glucose reference range applies only to samples taken after fasting for at least 8 hours.  CBC     Status: Abnormal   Collection Time: 11/11/23  6:08 AM  Result Value Ref Range   WBC 13.0 (H) 4.0 - 10.5 K/uL   RBC 3.74 (L) 4.22 - 5.81 MIL/uL   Hemoglobin 10.7 (L)  13.0 - 17.0 g/dL   HCT 16.1 (L) 09.6 - 04.5 %   MCV 83.4 80.0 - 100.0 fL   MCH 28.6 26.0 - 34.0 pg   MCHC 34.3 30.0 - 36.0 g/dL   RDW 40.9 81.1 - 91.4 %   Platelets 233 150 - 400 K/uL   nRBC 0.0 0.0 - 0.2 %    Comment: Performed at [facility], [address] [place], [place] [address]  Comprehensive metabolic panel     Status: Abnormal   Collection Time: 11/11/23  6:08 AM  Result Value Ref Range   Sodium 135 135 - 145 mmol/L   Potassium 3.6 3.5 - 5.1 mmol/L   Chloride 102 98 - 111 mmol/L   CO2 23 22 - 32 mmol/L   Glucose, Bld 232 (H) 70 - 99 mg/dL    Comment: Glucose reference range applies only to samples taken after fasting for at least 8 hours.   BUN 17 6  - 20 mg/dL   Creatinine, Ser 6.21 0.61 - 1.24 mg/dL   Calcium 7.9 (L) 8.9 - 10.3 mg/dL   Total Protein 5.8 (L) 6.5 - 8.1 g/dL   Albumin 2.5 (L) 3.5 - 5.0 g/dL   AST 16 15 - 41 U/L   ALT 27 0 - 44 U/L   Alkaline Phosphatase 80 38 - 126 U/L   Total Bilirubin 1.1 <1.2 mg/dL   GFR, Estimated >30 >86 mL/min    Comment: (NOTE) Calculated using the CKD-EPI Creatinine Equation (2021)    Anion gap 10 5 - 15    Comment: Performed at [facility], [address], [place], [place] [address]  Glucose, capillary     Status: Abnormal   Collection Time: 11/11/23  8:00 AM  Result Value Ref Range   Glucose-Capillary 242 (H) 70 - 99 mg/dL    Comment: Glucose reference range applies only to samples taken after fasting for at least 8 hours.  Glucose, capillary     Status: Abnormal   Collection Time: 11/11/23 12:20 PM  Result Value Ref Range   Glucose-Capillary 215 (H) 70 - 99 mg/dL    Comment: Glucose reference range applies only to samples taken after fasting for at least 8 hours.    No results found.  ROS Endorses mild groin discomfort.  Denies fevers.   Blood pressure (!) 129/91, pulse 79, temperature 98.7 F (37.1 C), resp. rate 16, weight 87.9 kg, SpO2 100%. Physical Exam Constitutional:      General: He is not in acute distress.    Appearance: Normal appearance. He is not toxic-appearing.  HENT:     Head: Normocephalic.  Eyes:     Extraocular Movements: Extraocular movements intact.     Pupils: Pupils are equal, round, and reactive to light.  Genitourinary:    Comments: Indwelling catheter in place.  Area of debridement his left hemiscrotum and inferiorly towards perineum.  Wound measures 13 x 6 x 2.5 cm.  Tracks superior to the left testicle as well as inferiorlaterally.  There is still some evidence of cutaneous necrosis that may benefit from debridement.  Wound itself appears relatively clean and without significant malodor, but with presence of slough and nonviable tissue.   See pictures Musculoskeletal:     Cervical back: Normal range of motion.  Skin:    Capillary Refill: Capillary refill takes less than 2 seconds.  Neurological:     General: No focal deficit present.     Mental Status: He is alert and  oriented to person, place, and time.         Assessment/Plan:  Scrotal wound subsequent to Fournier's gangrene:  There remains evidence of nonviable tissue and necrosis on wound examination.  Do not feel as though he is a candidate for primary closure at this time.  Instead, recommending continued enzymatic debridement with wet-to-dry dressings followed by ABD pad and netted underwear.  Patient may be a candidate for OR debridement and placement of tissue matrix for assistance with granulation in the next 7 to 10 days.  Will discuss case with Dr. Ulice Bold and continue to follow patient.  Scrotal wound care: Wet-to-dry dressing using Vashe soaked Kerlix followed by ABD pad and secured with netted underwear.  Change daily.  Evelena Leyden 11/11/2023, 3:43 PM

## 2023-11-11 NOTE — Plan of Care (Signed)
Pain meds admin prior to dressing change. Patient tolerated dressing change to scrotal/perineal area well as performed by Plastic Surgery PAs. This RN assisted to gather supplies and helped remind pt to breath slowly during procedure and provided other diversionary distraction. Dressing changes to be done daily as per orders. Patient was made aware and verbalizes understanding.

## 2023-11-11 NOTE — Inpatient Diabetes Management (Signed)
Inpatient Diabetes Program Recommendations  AACE/ADA: New Consensus Statement on Inpatient Glycemic Control (2015)  Target Ranges:  Prepandial:   less than 140 mg/dL      Peak postprandial:   less than 180 mg/dL (1-2 hours)      Critically ill patients:  140 - 180 mg/dL   Lab Results  Component Value Date   GLUCAP 215 (H) 11/11/2023   HGBA1C 10.6 (H) 11/09/2023    Review of Glycemic Control  Diabetes history: history of DM2 Outpatient Diabetes medications: none Current orders for Inpatient glycemic control: Semglee 12 units daily, Novolog 0-9 units correction scale TID  Inpatient Diabetes Program Recommendations:   Spoke with patient and father at the bedside. Patient states that he was in a health study in 2010. He was taking Metformin for a while, but it was causing side effects and he stopped it. He has not been on any medication for diabetes for some time. He does not have health insurance, but will be applying for Medicaid. He recently started a new job, but now has been out due the hospitalization. His father wears an insulin pump using U-500 insulin, his mother has diabetes. The father states that he will plan to get him in with his endocrinologist when his Medicaid is approved.   Discussed patient's HgbA1C of 10.6%. Discussed the need to get his A1C down and to control his blood sugars. Reviewed the insulin pen with him; he was able to return demonstration without problems. States that he had done this in the past many years ago. Patient needs affordable insulin if being discharged on insulin. Discussed using [employer] insulin until he can get his Medicaid approval.   Case manager to speak with patient. Will need to find a PCP for follow up. Awaiting follow up with plastic surgery here at [facility]. Will need prescription for home blood glucose meter, strips, and lancets (order #[id number]) at discharge. Will continue to follow blood glucose control while in the hospital.  Smith Mince RN  BSN CDE Diabetes Coordinator Pager: [phone]  8am-5pm

## 2023-11-11 NOTE — TOC Initial Note (Addendum)
Transition of Care (TOC) - Initial/Assessment Note   Spoke to patient and his father at bedside.   Patient from home with parents. Patient does not have insurance, however father reports he called [facility] financial assistance this am they started medicaid application, patient has signed application through [employer] Patent examiner.   Patient does not have a PCP, offered to arrange through a [facility] clinic. Father has PCP at [facility] and has already discussed cost with or without insurance for his son the establish care there with their office.   Diabetes Coordinator Enrique Sack discussed insulin being cheaper at [facility] with patient and father. Father is a diabetic and very familiar with medication assistance programs. NCM changed pharmacy to [facility] pharmacy, they will give patient cost at discharge, to see if he can afford. Can use [facility] after discharge also.  Patient Details  Name: Grant Curry MRN: [id number] Date of Birth: 10/08/1996  Transition [facility]) CM/SW Contact:    Kingsley Plan, RN Phone Number: 11/11/2023, 1:11 PM  Clinical Narrative:                   Expected Discharge Plan: Home/Self Care Barriers to Discharge: Continued Medical Work up   Patient Goals and CMS Choice Patient states their goals for this hospitalization and ongoing recovery are:: to return to home          Expected Discharge Plan and Services In-house Referral: Financial Counselor Discharge Planning Services: CM Consult Post Acute Care Choice: NA Living arrangements for the past 2 months: Single Family Home                 DME Arranged: N/A DME Agency: NA       HH Arranged: NA          Prior Living Arrangements/Services Living arrangements for the past 2 months: Single Family Home Lives with:: Parents Patient language and need for interpreter reviewed:: Yes Do you feel safe going back to the place where you live?: Yes      Need for Family  Participation in Patient Care: Yes (Comment) Care giver support system in place?: Yes (comment)   Criminal Activity/Legal Involvement Pertinent to Current Situation/Hospitalization: No - Comment as needed  Activities of Daily Living      Permission Sought/Granted   Permission granted to share information with : Yes, Verbal Permission Granted  Share Information with NAME: Caryn Bee father           Emotional Assessment Appearance:: Appears stated age Attitude/Demeanor/Rapport: Engaged Affect (typically observed): Accepting Orientation: : Oriented to Self, Oriented to Place, Oriented to  Time, Oriented to Situation Alcohol / Substance Use: Not Applicable Psych Involvement: No (comment)  Admission diagnosis:  Fournier gangrene in male [N49.3] Patient Active Problem List   Diagnosis Date Noted   DM2 (diabetes mellitus, type 2) (HCC) 11/11/2023   HLD (hyperlipidemia) 11/11/2023   Fournier gangrene in male 11/10/2023   Fournier gangrene 11/09/2023   Sepsis (HCC) 11/09/2023   Hyperglycemia 11/09/2023   Tobacco abuse 11/09/2023   PCP:  Oneita Hurt, No Pharmacy:   [facility] [address] [facility] - [place], [place] - [address] AT [facility] [address] [place] [address] Phone: [phone] Fax: [phone]  [facility] [address] [place] [address] Phone: [phone] Fax: [phone]  [facility] [address] [place] [address] Phone: [phone] Fax: [phone]  Social Determinants of Health (SDOH) Social History: SDOH Screenings   Tobacco Use: Low Risk  (11/10/2023)   SDOH Interventions:     Readmission Risk Interventions     No data to display

## 2023-11-11 NOTE — Progress Notes (Addendum)
PROGRESS NOTE  Grant Curry  ZOX:[id number] DOB: 07/04/1996 DOA: 11/10/2023 PCP: Pcp, No   Brief Narrative: Patient is a 27 year old male with  history of hypertension, diabetes type 2, tobacco use disorder who presented to [facility] initially with complaint of groin pain/scrotal swelling on 12/4.  He was found to have sepsis secondary to Fournier's gangrene.  He was started on broad-spectrum antibiotics, analgesics.  Urology consulted.  Underwent surgical debridement of the scrotum and perineal area.  Patient transferred to [facility] for inpatient plastic surgery consultation for primary closure of the surgical scrotal wound.  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  Found to have sepsis with Fournier's gangrene.  Status post scrotal exploration with surgical debridement on 12/4 and 12/5.  Currently on Zosyn and IV Zyvox.  Patient was transferred to [facility] for inpatient plastic surgery consultation.  Continue current antibiotics. Blood culture sent from [facility] has not shown any growth.  Wound culture sent from the OR is showing rare gram-positive cocci in pairs. . Follow-up the report. We consulted ID. He has mild leucocytosis  Type 2 diabetes with hyperglycemia: Continue current insulin regimen.  Monitor blood sugars.  A1c of 10.6.  Looks like patient is not taking any medications at home.  He might need to be started on insulin.  Diabetic coordinator  consulted.  Tobacco use: Counseled cessation.      DVT prophylaxis:heparin injection 5,000 Units Start: 11/10/23 2200     Code Status: Full Code  Family Communication: None at bedside  Patient status:Inpatient  Patient is from :Home  Anticipated discharge WJ:XBJY  Estimated DC date: After full workup from plastic surgery,ID clearance   Consultants: Plastic surgery, urology  Procedures: Surgical debridement  Antimicrobials:   Anti-infectives (From admission, onward)    Start     Dose/Rate Route Frequency Ordered Stop   11/10/23 2200  linezolid (ZYVOX) IVPB 600 mg        600 mg 300 mL/hr over 60 Minutes Intravenous Every 12 hours 11/10/23 2032     11/10/23 2200  piperacillin-tazobactam (ZOSYN) IVPB 3.375 g        3.375 g 12.5 mL/hr over 240 Minutes Intravenous Every 8 hours 11/10/23 2032         Subjective: Patient seen and examined at bedside today.  Hemodynamically stable.  Overall comfortable.  Lying in bed.  Denies any complaints while he is not moving.   Objective: Vitals:   11/10/23 2320 11/11/23 0407  BP: (!) 134/99 (!) 139/91  Pulse: 82 73  Resp: 20 20  Temp: 98.3 F (36.8 C) 98.1 F (36.7 C)  TempSrc: Oral Oral  SpO2: 100% 99%  Weight:  87.9 kg    Intake/Output Summary (Last 24 hours) at 11/11/2023 0758 Last data filed at 11/11/2023 0418 Gross per 24 hour  Intake 240 ml  Output 1575 ml  Net -1335 ml   Filed Weights   11/11/23 0407  Weight: 87.9 kg    Examination:  General exam: Overall comfortable, not in distress HEENT: PERRL Respiratory system:  no wheezes or crackles  Cardiovascular system: S1 & S2 heard, RRR.  Gastrointestinal system: Abdomen is nondistended, soft and nontender. Central nervous system: Alert and oriented Extremities: No edema, no clubbing ,no cyanosis Skin: No rashes, no ulcers,no icterus GU: scrotal wound with gauze,foley   Data Reviewed: I have personally reviewed following labs and imaging studies  CBC: Recent Labs  Lab 11/09/23 1203  11/09/23 1945 11/10/23 0549 11/10/23 2121 11/11/23 0608  WBC 21.4* 14.7* 16.5* 13.9* 13.0*  NEUTROABS 18.3*  --   --   --   --   HGB 14.0 10.6* 11.5* 11.7* 10.7*  HCT 40.2 30.7* 33.9* 34.3* 31.2*  MCV 81.2 82.7 84.1 83.5 83.4  PLT 279 199 215 254 233   Basic Metabolic Panel: Recent Labs  Lab 11/09/23 1203 11/09/23 1945 11/10/23 0549 11/10/23 2121 11/11/23 0608  NA 127*  --  135  --  135  K 4.0  --   3.9  --  3.6  CL 90*  --  101  --  102  CO2 23  --  23  --  23  GLUCOSE 434*  --  210*  --  232*  BUN 18  --  17  --  17  CREATININE 1.05 0.84 1.11 1.14 0.99  CALCIUM 9.0  --  7.5*  --  7.9*     Recent Results (from the past 240 hour(s))  Chlamydia/NGC rt PCR ([facility] only)     Status: None   Collection Time: 11/09/23 10:01 AM   Specimen: Urine; GU  Result Value Ref Range Status   Specimen source GC/Chlam URINE, RANDOM  Final   Chlamydia Tr NOT DETECTED NOT DETECTED Final   N gonorrhoeae NOT DETECTED NOT DETECTED Final    Comment: (NOTE) This CT/NG assay has not been evaluated in patients with a history of  hysterectomy. Performed at [facility], [address] [place], [place] [address]   Culture, blood (single)     Status: None (Preliminary result)   Collection Time: 11/09/23  2:05 PM   Specimen: BLOOD RIGHT ARM  Result Value Ref Range Status   Specimen Description BLOOD RIGHT ARM  Final   Special Requests   Final    BOTTLES DRAWN AEROBIC AND ANAEROBIC Blood Culture adequate volume   Culture  Setup Time IN BOTH AEROBIC AND ANAEROBIC BOTTLES  Final   Culture   Final    NO ORGANISMS SEEN Performed at [facility], [address], [place], [place] [address]    Report Status PENDING  Incomplete  Aerobic/Anaerobic Culture w Gram Stain (surgical/deep wound)     Status: None (Preliminary result)   Collection Time: 11/09/23  5:07 PM   Specimen: Path Tissue  Result Value Ref Range Status   Specimen Description   Final    TISSUE Performed at [facility], [address], [place], [place] [address]    Special Requests SCROTAL TISSUE SPEC A  Final   Gram Stain   Final    RARE WBC PRESENT, PREDOMINANTLY MONONUCLEAR RARE GRAM POSITIVE COCCI IN PAIRS Performed at [facility], [address] [place], [place] [address]    Culture PENDING  Incomplete   Report Status PENDING  Incomplete     Radiology Studies: CT ABDOMEN PELVIS W  CONTRAST  Result Date: 11/09/2023 CLINICAL DATA:  Fournier's gangrene.  Left scrotal swelling. EXAM: CT ABDOMEN AND PELVIS WITH CONTRAST TECHNIQUE: Multidetector CT imaging of the abdomen and pelvis was performed using the standard protocol following bolus administration of intravenous contrast. RADIATION DOSE REDUCTION: This exam was performed according to the departmental dose-optimization program which includes automated exposure control, adjustment of the mA and/or kV according to patient size and/or use of iterative reconstruction technique. CONTRAST:  OMNIPAQUE IOHEXOL 300 MG/ML  SOLN COMPARISON:  Ultrasound scrotum and Doppler exam from earlier the same day. FINDINGS: Lower chest: There is a  6.3 x 8.2 mm solid noncalcified nodule in the right lung lower lobe abutting the pleura. Please see follow-up recommendations below. The lung bases are otherwise clear. No pleural effusion. The heart is normal in size. No pericardial effusion. Hepatobiliary: The liver is normal in size. Non-cirrhotic configuration. No suspicious mass. There is a geographic, small, hypoattenuating area along the falciform ligament attachment site, which even though incompletely characterized on the current examination, favored to represent focal fatty infiltration. Attention on follow-up examination is recommended. No intrahepatic or extrahepatic bile duct dilation. Small volume dependent gallstones/sludge noted without imaging signs of acute cholecystitis. Normal gallbladder wall thickness. No pericholecystic inflammatory changes. Pancreas: Unremarkable. No pancreatic ductal dilatation or surrounding inflammatory changes. Spleen: Within normal limits. No focal lesion. Adrenals/Urinary Tract: Adrenal glands are unremarkable. No suspicious renal mass. No hydronephrosis. No renal or ureteric calculi. Unremarkable urinary bladder. Stomach/Bowel: No disproportionate dilation of the small or large bowel loops. No evidence of abnormal  bowel wall thickening or inflammatory changes. The appendix is unremarkable. Vascular/Lymphatic: No ascites or pneumoperitoneum. No abdominal or pelvic lymphadenopathy, by size criteria. No aneurysmal dilation of the major abdominal arteries. Reproductive: Normal-sized prostate gland. Symmetric bilateral seminal vesicles. Along the left side of the scrotum, there is diffuse hypoattenuating area. The internal attenuation of the area is 20-22 Hounsfield units. There is no discrete peripheral wall. There is small amount of such area along the superior anterior aspect of the scrotum also, surrounding both testicles. No evidence of air within the soft tissue. Findings may represent diffuse scrotal wall edema/cellulitis without frank abscess. Other: There is a tiny fat containing umbilical hernia. The soft tissues and abdominal wall are otherwise unremarkable. Musculoskeletal: No suspicious osseous lesions. There are mild multilevel degenerative changes in the visualized spine. IMPRESSION: *There are low-attenuation areas predominantly along the left side of the scrotum which also extends surrounding both testicles. No evidence of frank walled-off abscess or air within the soft tissue. Findings may represent diffuse scrotal wall edema/cellulitis without frank abscess. *Multiple other nonacute observations, as described above. Electronically Signed   By: Jules Schick M.D.   On: 11/09/2023 16:54   US SCROTUM W/DOPPLER  Result Date: 11/09/2023 CLINICAL DATA:  [id number] Swelling of left half of scrotum [id number] EXAM: SCROTAL ULTRASOUND DOPPLER ULTRASOUND OF THE TESTICLES TECHNIQUE: Complete ultrasound examination of the testicles, epididymis, and other scrotal structures was performed. Color and spectral Doppler ultrasound were also utilized to evaluate blood flow to the testicles. COMPARISON:  None Available. FINDINGS: Right testicle Measurements: 2.0 x 2.9 x 4.8 cm. No mass or microlithiasis visualized. Left testicle  Measurements: 2.0 x 2.8 x 4.4 cm. No mass or microlithiasis visualized. Right epididymis:  Normal in size and appearance. Left epididymis:  Normal in size and appearance. Hydrocele:  None visualized. Varicocele:  None visualized. Pulsed Doppler interrogation of both testes demonstrates normal low resistance arterial and venous waveforms bilaterally. There is diffuse asymmetric thickening along the left scrotal wall, as seen on the provided images. There are heterogeneous hypoechoic areas; however, no discrete drainable abscess or collection. IMPRESSION: *There is diffuse asymmetric thickening along the left scrotal wall, as seen on the provided images. There are heterogeneous hypoechoic areas; however, no discrete drainable abscess or collection. Findings may represent subcutaneous edema. Correlate clinically. *Otherwise unremarkable exam. Electronically Signed   By: Jules Schick M.D.   On: 11/09/2023 15:21    Scheduled Meds:  Chlorhexidine Gluconate Cloth  6 each Topical Q0600   heparin  5,000 Units Subcutaneous Q8H   insulin aspart  0-6 Units Subcutaneous TID WC   insulin glargine-yfgn  8 Units Subcutaneous Daily   Continuous Infusions:  linezolid 600 mg (11/11/23 0649)   piperacillin-tazobactam 3.375 g (11/11/23 0114)     LOS: 1 day   Burnadette Pop, MD [facility] P12/05/2023, 7:58 AM

## 2023-11-11 NOTE — Anesthesia Postprocedure Evaluation (Signed)
Anesthesia Post Note  Patient: Azrael Diallo  Procedure(s) Performed: SCROTUM EXPLORATION (Bilateral)  Patient location during evaluation: PACU Anesthesia Type: General Level of consciousness: awake and alert, oriented and patient cooperative Pain management: pain level controlled Vital Signs Assessment: post-procedure vital signs reviewed and stable Respiratory status: spontaneous breathing, nonlabored ventilation and respiratory function stable Cardiovascular status: blood pressure returned to baseline and stable Postop Assessment: adequate PO intake Anesthetic complications: no   No notable events documented.   Last Vitals:  Vitals:   11/10/23 1820 11/10/23 2002  BP: (!) 135/94 (!) 142/90  Pulse: 85 84  Resp: 18 19  Temp: 36.8 C 36.9 C  SpO2: 100% 100%    Last Pain:  Vitals:   11/10/23 2002  TempSrc: Oral  PainSc:                  Reed Breech

## 2023-11-11 NOTE — Consult Note (Signed)
[facility]    Date of Admission:  11/10/2023     Total days of antibiotics 1   Linezolid   Zosyn               Reason for Consult: Fournier's gangrene     Referring Provider: Renford Dills  Primary Care Provider: Pcp, No   Assessment: Grant Curry is a 27 y.o. male admitted with:   Fournier's Gangrene -  Now s/p serial debridements with urology at [facility] (Dr. Apolinar Junes). Wound with GPC on stain and GNRs growing c/w polymicrobial infection. Will continue current therapy with IV zosyn and linezolid for now and adjust further. No further plans for debridement but looking at plastics to help with reconstruction of the wound given its very large nature.  **May need to get urology back on board here given surgical debridement is the cornerstone of treatment here.  -Follow up plastics consult  / plans -continue IV zosyn + linezolid -Follow for ID on micro    T2DM -  Poor glycemic control. Seems to have a degree of low insight into disease management from conversations in chart. Mother is planning to take him to a new provider in [facility] to help with follow up.  -needs better glycemic control outpatient - mom working on new plan here   Smoking -  Would be best for him to stop smoking to help with wound healing.  -counseled.     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 male transferred to [facility] from [facility] for consultation of plastic surgery for reconstruction following serial debridements of fournier's gangrene involving scrotum and perineum.   Dr.  Apolinar Junes with urology debrided site x 2 with intra-op cultures showing GPC on stain and GNRs growing yet to be identfied.   He perseverates on why he was sent from [facility] so suddenly and without notice. Seems to be doing OK on antibiotics w/o any side effects. He says people only come in and out briefly when he is sleepy from medicine.   He has diabetes > 10 yrsbut does not use oral agents because of GI s/e. His A1C is > 10%.  He has no allergies to antibiotics.  He works at [employer]. He smokes cigarettes.    Review of Systems: Review of Systems  Constitutional:  Negative for chills and fever.  All other systems reviewed and are negative.    Past Medical History:  Diagnosis Date   Diabetes mellitus without complication (HCC)    Hypertension     Social History   Tobacco Use   Smoking status: Never   Smokeless tobacco: Never  Substance Use Topics   Alcohol use: Never   Drug use: Never    No family history on file. No Known Allergies  OBJECTIVE: Blood pressure (!) 129/91, pulse 79, temperature 98.7 F (37.1 C), resp. rate 16, weight 87.9 kg, SpO2 100%.  Physical Exam HENT:     Mouth/Throat:     Mouth: No oral  lesions.     Dentition: Normal dentition. No dental caries.  Eyes:     General: No scleral icterus. Cardiovascular:     Rate and Rhythm: Normal rate and regular rhythm.     Heart sounds: Normal heart sounds.  Pulmonary:     Effort: Pulmonary effort is normal.     Breath sounds: Normal breath sounds.  Abdominal:     General: There is no distension.     Palpations: Abdomen is soft.     Tenderness: There is no abdominal tenderness.  Genitourinary:    Comments: Photos reviewed in chart - very large open wound down the center of scrotum to perineum  Lymphadenopathy:     Cervical: No cervical adenopathy.  Skin:    General: Skin is warm and dry.     Findings: No rash.  Neurological:     Mental Status: He is alert and oriented to person, place, and time.      Lab Results Lab Results  Component Value Date   WBC 13.0 (H) 11/11/2023   HGB 10.7 (L) 11/11/2023   HCT 31.2 (L) 11/11/2023   MCV 83.4 11/11/2023   PLT 233 11/11/2023    Lab Results  Component Value Date   CREATININE 0.99 11/11/2023   BUN 17 11/11/2023   NA 135 11/11/2023   K 3.6 11/11/2023   CL 102 11/11/2023   CO2 23 11/11/2023    Lab Results  Component Value Date   ALT 27 11/11/2023   AST 16 11/11/2023   ALKPHOS 80 11/11/2023   BILITOT 1.1 11/11/2023     Microbiology: Recent Results (from the past 240 hour(s))  Chlamydia/NGC rt PCR ([facility] only)     Status: None   Collection Time: 11/09/23 10:01 AM   Specimen: Urine; GU  Result Value Ref Range Status   Specimen source GC/Chlam URINE, RANDOM  Final   Chlamydia Tr NOT DETECTED NOT DETECTED Final   N gonorrhoeae NOT DETECTED NOT DETECTED Final    Comment: (NOTE) This CT/NG assay has not been evaluated in patients with a history of  hysterectomy. Performed at [facility], [address] [place], [place] [address]   Culture, blood (single)     Status: None (Preliminary result)   Collection Time: 11/09/23  2:05 PM   Specimen: BLOOD RIGHT ARM  Result Value Ref Range Status   Specimen Description   Final    BLOOD RIGHT ARM Performed at [facility], [address], [place], [place] [address]    Special Requests   Final    BOTTLES DRAWN AEROBIC AND ANAEROBIC Blood Culture adequate volume Performed at [facility], [address], [place], [place] [address]    Culture   Final    NO GROWTH 2 DAYS Performed at [facility], [address] [place], [place] [address]    Report Status PENDING  Incomplete  Aerobic/Anaerobic Culture w Gram Stain (surgical/deep wound)     Status: None (Preliminary result)   Collection Time: 11/09/23  5:07 PM   Specimen: Path Tissue  Result Value Ref Range Status   Specimen Description   Final    TISSUE Performed at [facility],  [address] [place], [place] [address]    Special Requests SCROTAL TISSUE SPEC A  Final   Gram Stain   Final    RARE WBC PRESENT, PREDOMINANTLY MONONUCLEAR RARE GRAM POSITIVE COCCI IN PAIRS    Culture   Final    FEW GRAM NEGATIVE RODS  IDENTIFICATION AND SUSCEPTIBILITIES TO FOLLOW Performed at [facility], [address], [place], [place] [address]    Report Status PENDING  Incomplete    Rexene Alberts, MSN, NP-C [facility]  [email or web address] Pager: [phone] Office: [phone] [facility] Main Line: [phone] *Secure Chat Communication Welcome

## 2023-11-12 LAB — CBC
HCT: 30.9 % — ABNORMAL LOW (ref 39.0–52.0)
Hemoglobin: 10.4 g/dL — ABNORMAL LOW (ref 13.0–17.0)
MCH: 28.4 pg (ref 26.0–34.0)
MCHC: 33.7 g/dL (ref 30.0–36.0)
MCV: 84.4 fL (ref 80.0–100.0)
Platelets: 280 10*3/uL (ref 150–400)
RBC: 3.66 MIL/uL — ABNORMAL LOW (ref 4.22–5.81)
RDW: 13.2 % (ref 11.5–15.5)
WBC: 8.7 10*3/uL (ref 4.0–10.5)
nRBC: 0 % (ref 0.0–0.2)

## 2023-11-12 LAB — GLUCOSE, CAPILLARY
Glucose-Capillary: 221 mg/dL — ABNORMAL HIGH (ref 70–99)
Glucose-Capillary: 212 mg/dL — ABNORMAL HIGH (ref 70–99)
Glucose-Capillary: 211 mg/dL — ABNORMAL HIGH (ref 70–99)
Glucose-Capillary: 249 mg/dL — ABNORMAL HIGH (ref 70–99)

## 2023-11-12 NOTE — Plan of Care (Signed)
  Problem: Clinical Measurements: Goal: Ability to maintain clinical measurements within normal limits will improve Outcome: Progressing Goal: Respiratory complications will improve Outcome: Progressing Goal: Cardiovascular complication will be avoided Outcome: Progressing   Problem: Nutrition: Goal: Adequate nutrition will be maintained Outcome: Not Progressing   Problem: Safety: Goal: Ability to remain free from injury will improve Outcome: Progressing

## 2023-11-12 NOTE — Progress Notes (Signed)
PROGRESS NOTE  Grant Curry  AOZ:[id number] DOB: 12-12-1995 DOA: 11/10/2023 PCP: Pcp, No   Brief Narrative: Patient is a 27 year old male with  history of hypertension, diabetes type 2, tobacco use disorder who presented to [facility] initially with complaint of groin pain/scrotal swelling on 12/4.  He was found to have sepsis secondary to Fournier's gangrene.  He was started on broad-spectrum antibiotics, analgesics.  Urology consulted.  Underwent surgical debridement of the scrotum and perineal area.  Patient transferred to [facility] for inpatient plastic surgery consultation for primary closure of the surgical scrotal wound. 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  Found to have sepsis with Fournier's gangrene.  Status post scrotal exploration with surgical debridement on 12/4 and 12/5.  Currently on Zosyn and IV Zyvox.  Patient was transferred to [facility] for inpatient plastic surgery consultation.  Continue current antibiotics. Blood culture sent from [facility] has not shown any growth.  Wound culture sent from the OR is showing pan sensitive Ecoli.ID following Leucocytosis resolved Plastic surgery planning for I&D and placement of tissue matrix on Monday  Type 2 diabetes with hyperglycemia: Continue current insulin regimen.  Monitor blood sugars.  A1c of 10.6.  Looks like patient is not taking any medications at home.  He might need to be started on insulin.  Diabetic coordinator  consulted and following.  Tobacco use: Counseled cessation.      DVT prophylaxis:heparin injection 5,000 Units Start: 11/10/23 2200     Code Status: Full Code  Family Communication: None at bedside  Patient status:Inpatient  Patient is from :Home  Anticipated discharge NG:EXBM  Estimated DC date: After full workup from plastic  surgery,ID clearance   Consultants: Plastic surgery, urology  Procedures: Surgical debridement  Antimicrobials:  Anti-infectives (From admission, onward)    Start     Dose/Rate Route Frequency Ordered Stop   11/10/23 2200  linezolid (ZYVOX) IVPB 600 mg        600 mg 300 mL/hr over 60 Minutes Intravenous Every 12 hours 11/10/23 2032     11/10/23 2200  piperacillin-tazobactam (ZOSYN) IVPB 3.375 g        3.375 g 12.5 mL/hr over 240 Minutes Intravenous Every 8 hours 11/10/23 2032         Subjective: Patient seen and examined the bedside today.  Hemodynamically stable.  Afebrile.  Denies significant pain on the scrotum but does not allow me to examine it because he is scared  Objective: Vitals:   11/11/23 2102 11/12/23 0500 11/12/23 0501 11/12/23 0743  BP: 120/81  121/82 124/81  Pulse:   64 60  Resp: 17  18 18   Temp: 97.8 F (36.6 C)  97.7 F (36.5 C) 98.3 F (36.8 C)  TempSrc: Oral  Oral Oral  SpO2: 100%  98% 98%  Weight:  89.8 kg      Intake/Output Summary (Last 24 hours) at 11/12/2023 1339 Last data filed at 11/12/2023 1301 Gross per 24 hour  Intake 1137.25 ml  Output 425 ml  Net 712.25 ml   Filed Weights   11/11/23 0407 11/12/23 0500  Weight: 87.9 kg 89.8 kg    Examination: General exam: Overall comfortable, not in distress HEENT: PERRL Respiratory system:  no wheezes or crackles  Cardiovascular system: S1 & S2 heard, RRR.  Gastrointestinal system: Abdomen is nondistended, soft and nontender. Central nervous system:  Alert and oriented Extremities: No edema, no clubbing ,no cyanosis Skin: No rashes, no ulcers,no icterus   GU: scrotal wound with gauze,foley   Data Reviewed: I have personally reviewed following labs and imaging studies  CBC: Recent Labs  Lab 11/09/23 1203 11/09/23 1945 11/10/23 0549 11/10/23 2121 11/11/23 0608 11/12/23 0436  WBC 21.4* 14.7* 16.5* 13.9* 13.0* 8.7  NEUTROABS 18.3*  --   --   --   --   --   HGB 14.0 10.6* 11.5* 11.7*  10.7* 10.4*  HCT 40.2 30.7* 33.9* 34.3* 31.2* 30.9*  MCV 81.2 82.7 84.1 83.5 83.4 84.4  PLT 279 199 215 254 233 280   Basic Metabolic Panel: Recent Labs  Lab 11/09/23 1203 11/09/23 1945 11/10/23 0549 11/10/23 2121 11/11/23 0608  NA 127*  --  135  --  135  K 4.0  --  3.9  --  3.6  CL 90*  --  101  --  102  CO2 23  --  23  --  23  GLUCOSE 434*  --  210*  --  232*  BUN 18  --  17  --  17  CREATININE 1.05 0.84 1.11 1.14 0.99  CALCIUM 9.0  --  7.5*  --  7.9*     Recent Results (from the past 240 hour(s))  Chlamydia/NGC rt PCR ([facility] only)     Status: None   Collection Time: 11/09/23 10:01 AM   Specimen: Urine; GU  Result Value Ref Range Status   Specimen source GC/Chlam URINE, RANDOM  Final   Chlamydia Tr NOT DETECTED NOT DETECTED Final   N gonorrhoeae NOT DETECTED NOT DETECTED Final    Comment: (NOTE) This CT/NG assay has not been evaluated in patients with a history of  hysterectomy. Performed at [facility], [address] [place], [place] [address]   Culture, blood (single)     Status: None (Preliminary result)   Collection Time: 11/09/23  2:05 PM   Specimen: BLOOD RIGHT ARM  Result Value Ref Range Status   Specimen Description BLOOD RIGHT ARM  Final   Special Requests   Final    BOTTLES DRAWN AEROBIC AND ANAEROBIC Blood Culture adequate volume   Culture   Final    NO GROWTH 3 DAYS Performed at [facility], [address], [place], [place] [address]    Report Status PENDING  Incomplete  Aerobic/Anaerobic Culture w Gram Stain (surgical/deep wound)     Status: None (Preliminary result)   Collection Time: 11/09/23  5:07 PM   Specimen: Path Tissue  Result Value Ref Range Status   Specimen Description   Final    TISSUE Performed at [facility], [address] [place], [place] [address]    Special Requests SCROTAL TISSUE SPEC A  Final   Gram Stain   Final    RARE WBC PRESENT, PREDOMINANTLY MONONUCLEAR RARE GRAM POSITIVE COCCI IN  PAIRS Performed at [facility], [address], [place], [place] [address]    Culture FEW ESCHERICHIA COLI  Final   Report Status PENDING  Incomplete   Organism ID, Bacteria ESCHERICHIA COLI  Final      Susceptibility   Escherichia coli - MIC*    AMPICILLIN <=2 SENSITIVE Sensitive     CEFEPIME <=0.12 SENSITIVE Sensitive     CEFTAZIDIME <=1 SENSITIVE Sensitive     CEFTRIAXONE <=0.25 SENSITIVE Sensitive     CIPROFLOXACIN <=0.25 SENSITIVE Sensitive     GENTAMICIN <=1 SENSITIVE Sensitive  IMIPENEM <=0.25 SENSITIVE Sensitive     TRIMETH/SULFA <=20 SENSITIVE Sensitive     AMPICILLIN/SULBACTAM <=2 SENSITIVE Sensitive     PIP/TAZO <=4 SENSITIVE Sensitive ug/mL    * FEW ESCHERICHIA COLI     Radiology Studies: No results found.  Scheduled Meds:  Chlorhexidine Gluconate Cloth  6 each Topical Q0600   heparin  5,000 Units Subcutaneous Q8H   insulin aspart  0-9 Units Subcutaneous TID WC   insulin glargine-yfgn  12 Units Subcutaneous Daily   Continuous Infusions:  linezolid 600 mg (11/12/23 1027)   piperacillin-tazobactam 3.375 g (11/12/23 0634)     LOS: 2 days   Burnadette Pop, MD [facility] P12/06/2023, 1:39 PM

## 2023-11-13 ENCOUNTER — Other Ambulatory Visit: Payer: Self-pay

## 2023-11-13 ENCOUNTER — Encounter (HOSPITAL_COMMUNITY): Payer: Self-pay | Admitting: Internal Medicine

## 2023-11-13 LAB — GLUCOSE, CAPILLARY
Glucose-Capillary: 204 mg/dL — ABNORMAL HIGH (ref 70–99)
Glucose-Capillary: 164 mg/dL — ABNORMAL HIGH (ref 70–99)
Glucose-Capillary: 135 mg/dL — ABNORMAL HIGH (ref 70–99)
Glucose-Capillary: 106 mg/dL — ABNORMAL HIGH (ref 70–99)

## 2023-11-13 MED ORDER — SODIUM CHLORIDE 0.9 % IV SOLN
3.0000 g | Freq: Four times a day (QID) | INTRAVENOUS | Status: DC
  Administered 2023-11-13 – 2023-11-15 (×9): 3 g via INTRAVENOUS
  Filled 2023-11-13 (×9): qty 8

## 2023-11-13 MED ORDER — LINEZOLID 600 MG PO TABS
600.0000 mg | ORAL_TABLET | Freq: Two times a day (BID) | ORAL | Status: DC
  Administered 2023-11-13 – 2023-11-15 (×5): 600 mg via ORAL
  Filled 2023-11-13 (×6): qty 1

## 2023-11-13 NOTE — Plan of Care (Signed)
  Problem: Pain Management: Goal: General experience of comfort will improve Outcome: Progressing   Problem: Safety: Goal: Ability to remain free from injury will improve Outcome: Progressing

## 2023-11-13 NOTE — Progress Notes (Signed)
PROGRESS NOTE  Grant Curry  OZH:[id number] DOB: 09/09/1996 DOA: 11/10/2023 PCP: Pcp, No   Brief Narrative: Patient is a 27 year old male with  history of hypertension, diabetes type 2, tobacco use disorder who presented to [facility] initially with complaint of groin pain/scrotal swelling on 12/4.  He was found to have sepsis secondary to Fournier's gangrene.  He was started on broad-spectrum antibiotics, analgesics.  Urology consulted.  Underwent surgical debridement of the scrotum and perineal area.  Patient transferred to [facility] for inpatient plastic surgery consultation for primary closure of the surgical scrotal wound. Plastic surgery planning for I&D and placement of tissue matrix on Monday.  12/8: Hemodynamically stable, going to the OR with plastic surgery tomorrow.  ID is recommending continuation of Zyvox as there is another possible isolate growing in culture.  Current cultures with pansensitive E. Coli.  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  Found to have sepsis with Fournier's gangrene.  Status post scrotal exploration with surgical debridement on 12/4 and 12/5.  Currently on Zosyn and IV Zyvox.  Patient was transferred to [facility] for inpatient plastic surgery consultation.  Continue current antibiotics. Blood culture sent from [facility] has not shown any growth.  Wound culture sent from the OR is showing pan sensitive Ecoli.ID following Leucocytosis resolved Plastic surgery planning for I&D and placement of tissue matrix on Monday  Type 2 diabetes with hyperglycemia: Continue current insulin regimen.  Monitor blood sugars.  A1c of 10.6.  Looks like patient is not taking any medications at home.  He might need to be started on insulin.  Diabetic coordinator  consulted and following.  Tobacco use: Counseled cessation.      DVT prophylaxis:heparin injection 5,000 Units Start:  11/10/23 2200     Code Status: Full Code  Family Communication: None at bedside  Patient status:Inpatient  Patient is from :Home  Anticipated discharge GE:XBMW  Estimated DC date: After full workup from plastic surgery,ID clearance   Consultants: Plastic surgery, urology  Procedures: Surgical debridement  Antimicrobials:  Anti-infectives (From admission, onward)    Start     Dose/Rate Route Frequency Ordered Stop   11/13/23 1045  linezolid (ZYVOX) tablet 600 mg        600 mg Oral Every 12 hours 11/13/23 0955     11/13/23 1045  Ampicillin-Sulbactam (UNASYN) 3 g in sodium chloride 0.9 % 100 mL IVPB        3 g 200 mL/hr over 30 Minutes Intravenous Every 6 hours 11/13/23 0956     11/10/23 2200  linezolid (ZYVOX) IVPB 600 mg  Status:  Discontinued        600 mg 300 mL/hr over 60 Minutes Intravenous Every 12 hours 11/10/23 2032 11/13/23 0955   11/10/23 2200  piperacillin-tazobactam (ZOSYN) IVPB 3.375 g  Status:  Discontinued        3.375 g 12.5 mL/hr over 240 Minutes Intravenous Every 8 hours 11/10/23 2032 11/13/23 0955       Subjective: Patient was seen and examined today.  No new concern.  He was little anxious about the procedure tomorrow.  Objective: Vitals:   11/12/23 2136 11/13/23 0500 11/13/23 0502 11/13/23 0746  BP: 125/81  (!) 143/90 (!) 135/92  Pulse: 72  67 74  Resp: 17  17 16   Temp: 97.6 F (36.4 C)  97.7 F (36.5 C) 97.6 F (36.4 C)  TempSrc: Oral  Oral Oral  SpO2: 98%  99% 99%  Weight:  90.8 kg      Intake/Output Summary (Last 24 hours) at 11/13/2023 1453 Last data filed at 11/13/2023 0500 Gross per 24 hour  Intake --  Output 900 ml  Net -900 ml   Filed Weights   11/11/23 0407 11/12/23 0500 11/13/23 0500  Weight: 87.9 kg 89.8 kg 90.8 kg    Examination: General.  Well-developed gentleman, in no acute distress. Pulmonary.  Lungs clear bilaterally, normal respiratory effort. CV.  Regular rate and rhythm, no JVD, rub or murmur. Abdomen.   Soft, nontender, nondistended, BS positive. CNS.  Alert and oriented .  No focal neurologic deficit. Extremities.  No edema, no cyanosis, pulses intact and symmetrical. Psychiatry.  Judgment and insight appears normal.    Data Reviewed: I have personally reviewed following labs and imaging studies  CBC: Recent Labs  Lab 11/09/23 1203 11/09/23 1945 11/10/23 0549 11/10/23 2121 11/11/23 0608 11/12/23 0436  WBC 21.4* 14.7* 16.5* 13.9* 13.0* 8.7  NEUTROABS 18.3*  --   --   --   --   --   HGB 14.0 10.6* 11.5* 11.7* 10.7* 10.4*  HCT 40.2 30.7* 33.9* 34.3* 31.2* 30.9*  MCV 81.2 82.7 84.1 83.5 83.4 84.4  PLT 279 199 215 254 233 280   Basic Metabolic Panel: Recent Labs  Lab 11/09/23 1203 11/09/23 1945 11/10/23 0549 11/10/23 2121 11/11/23 0608  NA 127*  --  135  --  135  K 4.0  --  3.9  --  3.6  CL 90*  --  101  --  102  CO2 23  --  23  --  23  GLUCOSE 434*  --  210*  --  232*  BUN 18  --  17  --  17  CREATININE 1.05 0.84 1.11 1.14 0.99  CALCIUM 9.0  --  7.5*  --  7.9*     Recent Results (from the past 240 hour(s))  Chlamydia/NGC rt PCR ([facility] only)     Status: None   Collection Time: 11/09/23 10:01 AM   Specimen: Urine; GU  Result Value Ref Range Status   Specimen source GC/Chlam URINE, RANDOM  Final   Chlamydia Tr NOT DETECTED NOT DETECTED Final   N gonorrhoeae NOT DETECTED NOT DETECTED Final    Comment: (NOTE) This CT/NG assay has not been evaluated in patients with a history of  hysterectomy. Performed at [facility], [address] [place], [place] [address]   Culture, blood (single)     Status: None (Preliminary result)   Collection Time: 11/09/23  2:05 PM   Specimen: BLOOD RIGHT ARM  Result Value Ref Range Status   Specimen Description BLOOD RIGHT ARM  Final   Special Requests   Final    BOTTLES DRAWN AEROBIC AND ANAEROBIC Blood Culture adequate volume   Culture   Final    NO GROWTH 4 DAYS Performed at [facility], [address] [place], [place] [address]    Report Status PENDING  Incomplete  Aerobic/Anaerobic Culture w Gram Stain (surgical/deep wound)     Status: None (Preliminary result)   Collection Time: 11/09/23  5:07 PM   Specimen: Path Tissue  Result Value Ref Range Status   Specimen Description   Final    TISSUE Performed at [facility], [address], [place], [place] [address]    Special Requests SCROTAL TISSUE SPEC A  Final   Gram Stain   Final    RARE WBC PRESENT, PREDOMINANTLY MONONUCLEAR  RARE GRAM POSITIVE COCCI IN PAIRS    Culture   Final    FEW ESCHERICHIA COLI CULTURE REINCUBATED FOR BETTER GROWTH Performed at [facility], [address] [place], [place] [address]    Report Status PENDING  Incomplete   Organism ID, Bacteria ESCHERICHIA COLI  Final      Susceptibility   Escherichia coli - MIC*    AMPICILLIN <=2 SENSITIVE Sensitive     CEFEPIME <=0.12 SENSITIVE Sensitive     CEFTAZIDIME <=1 SENSITIVE Sensitive     CEFTRIAXONE <=0.25 SENSITIVE Sensitive     CIPROFLOXACIN <=0.25 SENSITIVE Sensitive     GENTAMICIN <=1 SENSITIVE Sensitive     IMIPENEM <=0.25 SENSITIVE Sensitive     TRIMETH/SULFA <=20 SENSITIVE Sensitive     AMPICILLIN/SULBACTAM <=2 SENSITIVE Sensitive     PIP/TAZO <=4 SENSITIVE Sensitive ug/mL    * FEW ESCHERICHIA COLI     Radiology Studies: No results found.  Scheduled Meds:  Chlorhexidine Gluconate Cloth  6 each Topical Q0600   heparin  5,000 Units Subcutaneous Q8H   insulin aspart  0-9 Units Subcutaneous TID WC   insulin glargine-yfgn  12 Units Subcutaneous Daily   linezolid  600 mg Oral Q12H   Continuous Infusions:  ampicillin-sulbactam (UNASYN) IV 3 g (11/13/23 1015)     LOS: 3 days   Arnetha Courser, MD [facility] P12/07/2023, 2:53 PM

## 2023-11-13 NOTE — Plan of Care (Signed)
  Problem: Pain Management: Goal: General experience of comfort will improve 11/13/2023 0150 by Sammuel Cooper, RN Outcome: Progressing 11/13/2023 0147 by Sammuel Cooper, RN Outcome: Progressing   Problem: Safety: Goal: Ability to remain free from injury will improve 11/13/2023 0150 by Sammuel Cooper, RN Outcome: Progressing 11/13/2023 0147 by Sammuel Cooper, RN Outcome: Progressing

## 2023-11-13 NOTE — Plan of Care (Signed)
  Problem: Clinical Measurements: Goal: Ability to maintain clinical measurements within normal limits will improve Outcome: Progressing   Problem: Activity: Goal: Risk for activity intolerance will decrease Outcome: Progressing   Problem: Safety: Goal: Ability to remain free from injury will improve Outcome: Progressing   Problem: Skin Integrity: Goal: Risk for impaired skin integrity will decrease Outcome: Progressing   Problem: Tissue Perfusion: Goal: Adequacy of tissue perfusion will improve Outcome: Progressing

## 2023-11-13 NOTE — Progress Notes (Signed)
Mobility Specialist: Progress Note   11/13/23 1636  Mobility  Activity Ambulated with assistance in hallway  Level of Assistance Contact guard assist, steadying assist  Assistive Device Front wheel walker  Distance Ambulated (ft) 200 ft  Activity Response Tolerated well  Mobility Referral Yes  Mobility visit 1 Mobility  Mobility Specialist Start Time (ACUTE ONLY) 1403  Mobility Specialist Stop Time (ACUTE ONLY) 1417  Mobility Specialist Time Calculation (min) (ACUTE ONLY) 14 min    Pt was agreeable to mobility session - received in bed. Had scrotum/groin pain but staed it was tolerable. SV for bed mobility and transfer. CG for ambulation that progressed to SV. Ambulated with wide BOS d/t injury. Returned to room without fault. Left in chair with all needs met, call bell in reach.   Maurene Capes Mobility Specialist Please contact via SecureChat or Rehab office at [phone]

## 2023-11-13 NOTE — Progress Notes (Signed)
ID PROGRESS NOTE  Will change piptazo to amp/sub to still provide anaerobic coverage and covers pan-sensitive ecoli. Culture appears to still have another isolate to identify Continue on linezolid for additional 2-3 days pending [facility] identification  Gram Stain RARE WBC PRESENT, PREDOMINANTLY MONONUCLEAR RARE GRAM POSITIVE COCCI IN PAIRS  Culture FEW ESCHERICHIA COLI CULTURE REINCUBATED FOR BETTER GROWTH Performed at [facility], [address], [place], [place] [address]  Report Status PENDING  Organism ID, Bacteria ESCHERICHIA COLI  Resulting Agency [facility]     Susceptibility    Escherichia coli    MIC    AMPICILLIN <=2 SENSITIVE Sensitive    AMPICILLIN/SULBACTAM <=2 SENSITIVE Sensitive    CEFEPIME <=0.12 SENS... Sensitive    CEFTAZIDIME <=1 SENSITIVE Sensitive    CEFTRIAXONE <=0.25 SENS... Sensitive    CIPROFLOXACIN <=0.25 SENS... Sensitive    GENTAMICIN <=1 SENSITIVE Sensitive    IMIPENEM <=0.25 SENS... Sensitive    PIP/TAZO <=4 SENSITI... Sensitive    TRIMETH/SULFA <=20 SENSIT... Sensitive

## 2023-11-14 ENCOUNTER — Inpatient Hospital Stay (HOSPITAL_COMMUNITY): Payer: Medicaid Other | Admitting: Anesthesiology

## 2023-11-14 ENCOUNTER — Other Ambulatory Visit: Payer: Self-pay

## 2023-11-14 ENCOUNTER — Encounter (HOSPITAL_COMMUNITY)
Admission: EM | Disposition: A | Discharge status: Home Health | Payer: Self-pay | Source: Other Acute Inpatient Hospital | Attending: Internal Medicine

## 2023-11-14 ENCOUNTER — Encounter (HOSPITAL_COMMUNITY): Payer: Self-pay | Admitting: Internal Medicine

## 2023-11-14 DIAGNOSIS — N493 Fournier gangrene: Secondary | ICD-10-CM

## 2023-11-14 HISTORY — PX: INCISION AND DRAINAGE OF WOUND: SHX1803

## 2023-11-14 LAB — GLUCOSE, CAPILLARY
Glucose-Capillary: 108 mg/dL — ABNORMAL HIGH (ref 70–99)
Glucose-Capillary: 111 mg/dL — ABNORMAL HIGH (ref 70–99)
Glucose-Capillary: 112 mg/dL — ABNORMAL HIGH (ref 70–99)
Glucose-Capillary: 139 mg/dL — ABNORMAL HIGH (ref 70–99)
Glucose-Capillary: 176 mg/dL — ABNORMAL HIGH (ref 70–99)
Glucose-Capillary: 171 mg/dL — ABNORMAL HIGH (ref 70–99)

## 2023-11-14 LAB — CULTURE, BLOOD (SINGLE)
Culture: NO GROWTH
Special Requests: ADEQUATE

## 2023-11-14 LAB — AEROBIC/ANAEROBIC CULTURE W GRAM STAIN (SURGICAL/DEEP WOUND)

## 2023-11-14 SURGERY — IRRIGATION AND DEBRIDEMENT WOUND
Anesthesia: General | Site: Groin

## 2023-11-14 MED ORDER — ONDANSETRON HCL 4 MG/2ML IJ SOLN
INTRAMUSCULAR | Status: AC
  Filled 2023-11-14: qty 2

## 2023-11-14 MED ORDER — FENTANYL CITRATE (PF) 250 MCG/5ML IJ SOLN
INTRAMUSCULAR | Status: AC
  Filled 2023-11-14: qty 5

## 2023-11-14 MED ORDER — CEFAZOLIN SODIUM-DEXTROSE 2-4 GM/100ML-% IV SOLN
2.0000 g | INTRAVENOUS | Status: AC
  Administered 2023-11-14: 2 g via INTRAVENOUS
  Filled 2023-11-14: qty 100

## 2023-11-14 MED ORDER — DEXAMETHASONE SODIUM PHOSPHATE 10 MG/ML IJ SOLN
INTRAMUSCULAR | Status: AC
  Filled 2023-11-14: qty 1

## 2023-11-14 MED ORDER — DEXMEDETOMIDINE HCL IN NACL 80 MCG/20ML IV SOLN
INTRAVENOUS | Status: AC
  Filled 2023-11-14: qty 20

## 2023-11-14 MED ORDER — FENTANYL CITRATE (PF) 250 MCG/5ML IJ SOLN
INTRAMUSCULAR | Status: DC | PRN
  Administered 2023-11-14: 100 ug via INTRAVENOUS
  Administered 2023-11-14 (×3): 50 ug via INTRAVENOUS

## 2023-11-14 MED ORDER — PROPOFOL 10 MG/ML IV BOLUS
INTRAVENOUS | Status: AC
  Filled 2023-11-14: qty 20

## 2023-11-14 MED ORDER — BUPIVACAINE LIPOSOME 1.3 % IJ SUSP
INTRAMUSCULAR | Status: AC
  Filled 2023-11-14: qty 20

## 2023-11-14 MED ORDER — CHLORHEXIDINE GLUCONATE 0.12 % MT SOLN: 15.0000 mL | Freq: Once | OROMUCOSAL | Status: AC

## 2023-11-14 MED ORDER — INSULIN ASPART 100 UNIT/ML IJ SOLN: 0.0000 [IU] | INTRAMUSCULAR | Status: DC | PRN

## 2023-11-14 MED ORDER — ROCURONIUM BROMIDE 10 MG/ML (PF) SYRINGE
PREFILLED_SYRINGE | INTRAVENOUS | Status: AC
  Filled 2023-11-14: qty 10

## 2023-11-14 MED ORDER — CHLORHEXIDINE GLUCONATE CLOTH 2 % EX PADS: 6.0000 | MEDICATED_PAD | Freq: Once | CUTANEOUS | Status: DC

## 2023-11-14 MED ORDER — LACTATED RINGERS IV SOLN
INTRAVENOUS | Status: DC
Start: 2023-11-14 — End: 2023-11-14

## 2023-11-14 MED ORDER — PROPOFOL 10 MG/ML IV BOLUS
INTRAVENOUS | Status: DC | PRN
  Administered 2023-11-14: 150 mg via INTRAVENOUS

## 2023-11-14 MED ORDER — BUPIVACAINE LIPOSOME 1.3 % IJ SUSP
INTRAMUSCULAR | Status: DC | PRN
  Administered 2023-11-14: 20 mL

## 2023-11-14 MED ORDER — ACETAMINOPHEN 500 MG PO TABS
1000.0000 mg | ORAL_TABLET | Freq: Once | ORAL | Status: AC
Start: 2023-11-14 — End: 2023-11-14
  Administered 2023-11-14: 1000 mg via ORAL
  Filled 2023-11-14: qty 2

## 2023-11-14 MED ORDER — MIDAZOLAM HCL 2 MG/2ML IJ SOLN
INTRAMUSCULAR | Status: AC
  Filled 2023-11-14: qty 2

## 2023-11-14 MED ORDER — DEXAMETHASONE SODIUM PHOSPHATE 10 MG/ML IJ SOLN
INTRAMUSCULAR | Status: DC | PRN
  Administered 2023-11-14: 5 mg via INTRAVENOUS

## 2023-11-14 MED ORDER — ROCURONIUM BROMIDE 10 MG/ML (PF) SYRINGE
PREFILLED_SYRINGE | INTRAVENOUS | Status: DC | PRN
  Administered 2023-11-14: 60 mg via INTRAVENOUS

## 2023-11-14 MED ORDER — DEXMEDETOMIDINE HCL IN NACL 80 MCG/20ML IV SOLN
INTRAVENOUS | Status: DC | PRN
Start: 2023-11-14 — End: 2023-11-14
  Administered 2023-11-14: 8 ug via INTRAVENOUS
  Administered 2023-11-14: 4 ug via INTRAVENOUS
  Administered 2023-11-14: 8 ug via INTRAVENOUS

## 2023-11-14 MED ORDER — MIDAZOLAM HCL 2 MG/2ML IJ SOLN
INTRAMUSCULAR | Status: DC | PRN
  Administered 2023-11-14: 2 mg via INTRAVENOUS

## 2023-11-14 MED ORDER — CHLORHEXIDINE GLUCONATE 0.12 % MT SOLN
OROMUCOSAL | Status: AC
  Administered 2023-11-14: 15 mL via OROMUCOSAL
  Filled 2023-11-14: qty 15

## 2023-11-14 MED ORDER — VASHE WOUND IRRIGATION OPTIME
TOPICAL | Status: DC | PRN
  Administered 2023-11-14 (×2): 34 [oz_av]

## 2023-11-14 MED ORDER — 0.9 % SODIUM CHLORIDE (POUR BTL) OPTIME
TOPICAL | Status: DC | PRN
  Administered 2023-11-14: 1000 mL

## 2023-11-14 MED ORDER — SUGAMMADEX SODIUM 200 MG/2ML IV SOLN
INTRAVENOUS | Status: DC | PRN
  Administered 2023-11-14: 170 mg via INTRAVENOUS

## 2023-11-14 MED ORDER — LIDOCAINE 2% (20 MG/ML) 5 ML SYRINGE
INTRAMUSCULAR | Status: AC
  Filled 2023-11-14: qty 5

## 2023-11-14 MED ORDER — LIDOCAINE 2% (20 MG/ML) 5 ML SYRINGE
INTRAMUSCULAR | Status: DC | PRN
  Administered 2023-11-14: 100 mg via INTRAVENOUS

## 2023-11-14 MED ORDER — ORAL CARE MOUTH RINSE: 15.0000 mL | Freq: Once | OROMUCOSAL | Status: AC

## 2023-11-14 MED ORDER — LIDOCAINE-EPINEPHRINE 1 %-1:100000 IJ SOLN
INTRAMUSCULAR | Status: AC
  Filled 2023-11-14: qty 1

## 2023-11-14 SURGICAL SUPPLY — 56 items
APPLICATOR COTTON TIP 6 STRL (MISCELLANEOUS) IMPLANT
APPLICATOR COTTON TIP 6IN STRL (MISCELLANEOUS) IMPLANT
BAG COUNTER SPONGE SURGICOUNT (BAG) ×1 IMPLANT
BAG DECANTER FOR FLEXI CONT (MISCELLANEOUS) IMPLANT
BENZOIN TINCTURE PRP APPL 2/3 (GAUZE/BANDAGES/DRESSINGS) ×1 IMPLANT
BNDG GAUZE DERMACEA FLUFF 4 (GAUZE/BANDAGES/DRESSINGS) IMPLANT
CANISTER SUCT 3000ML PPV (MISCELLANEOUS) ×1 IMPLANT
CLEANSER WND VASHE 34 (WOUND CARE) IMPLANT
CNTNR URN SCR LID CUP LEK RST (MISCELLANEOUS) IMPLANT
COVER SURGICAL LIGHT HANDLE (MISCELLANEOUS) ×1 IMPLANT
DRAIN CHANNEL 19F RND (DRAIN) IMPLANT
DRAIN JP 10F RND SILICONE (MISCELLANEOUS) IMPLANT
DRAPE HALF SHEET 40X57 (DRAPES) IMPLANT
DRAPE IMP U-DRAPE 54X76 (DRAPES) ×1 IMPLANT
DRAPE INCISE IOBAN 66X45 STRL (DRAPES) IMPLANT
DRAPE LAPAROSCOPIC ABDOMINAL (DRAPES) IMPLANT
DRAPE LAPAROTOMY 100X72 PEDS (DRAPES) ×1 IMPLANT
DRESSING MORCELLS FINE 1000 (Tissue) IMPLANT
DRSG ADAPTIC 3X8 NADH LF (GAUZE/BANDAGES/DRESSINGS) IMPLANT
DRSG CUTIMED SORBACT 7X9 (GAUZE/BANDAGES/DRESSINGS) IMPLANT
DRSG VAC GRANUFOAM LG (GAUZE/BANDAGES/DRESSINGS) IMPLANT
DRSG VAC GRANUFOAM MED (GAUZE/BANDAGES/DRESSINGS) IMPLANT
DRSG VAC GRANUFOAM SM (GAUZE/BANDAGES/DRESSINGS) IMPLANT
ELECT CAUTERY BLADE 6.4 (BLADE) IMPLANT
ELECT REM PT RETURN 9FT ADLT (ELECTROSURGICAL) ×1 IMPLANT
ELECTRODE REM PT RTRN 9FT ADLT (ELECTROSURGICAL) ×1 IMPLANT
GAUZE PAD ABD 8X10 STRL (GAUZE/BANDAGES/DRESSINGS) IMPLANT
GAUZE SPONGE 4X4 12PLY STRL (GAUZE/BANDAGES/DRESSINGS) IMPLANT
GLOVE BIO SURGEON STRL SZ 6.5 (GLOVE) ×1 IMPLANT
GLOVE BIOGEL M 6.5 STRL (GLOVE) ×1 IMPLANT
GOWN STRL REUS W/ TWL LRG LVL3 (GOWN DISPOSABLE) ×3 IMPLANT
GRAFT MYRIAD 3 LAYER 7X10 (Graft) IMPLANT
KIT BASIN OR (CUSTOM PROCEDURE TRAY) ×1 IMPLANT
KIT TURNOVER KIT B (KITS) ×1 IMPLANT
NDL HYPO 25GX1X1/2 BEV (NEEDLE) ×1 IMPLANT
NEEDLE HYPO 25GX1X1/2 BEV (NEEDLE) ×2 IMPLANT
NS IRRIG 1000ML POUR BTL (IV SOLUTION) ×1 IMPLANT
PACK GENERAL/GYN (CUSTOM PROCEDURE TRAY) ×1 IMPLANT
PACK UNIVERSAL I (CUSTOM PROCEDURE TRAY) ×1 IMPLANT
PAD ARMBOARD 7.5X6 YLW CONV (MISCELLANEOUS) ×2 IMPLANT
STAPLER VISISTAT 35W (STAPLE) ×1 IMPLANT
SURGILUBE 2OZ TUBE FLIPTOP (MISCELLANEOUS) IMPLANT
SUT MNCRL AB 3-0 PS2 27 (SUTURE) IMPLANT
SUT MNCRL AB 4-0 PS2 18 (SUTURE) IMPLANT
SUT MON AB 2-0 CT1 36 (SUTURE) IMPLANT
SUT MON AB 3-0 SH27 (SUTURE) IMPLANT
SUT MON AB 4-0 PS1 27 (SUTURE) IMPLANT
SUT MON AB 5-0 PS2 18 (SUTURE) IMPLANT
SUT VIC AB 5-0 PS2 18 (SUTURE) IMPLANT
SUT VICRYL 3 0 (SUTURE) IMPLANT
SUT VICRYL 5 0 PS 3 18 (SUTURE) IMPLANT
SWAB COLLECTION DEVICE MRSA (MISCELLANEOUS) IMPLANT
SWAB CULTURE ESWAB REG 1ML (MISCELLANEOUS) IMPLANT
SYR CONTROL 10ML LL (SYRINGE) ×1 IMPLANT
TOWEL GREEN STERILE (TOWEL DISPOSABLE) ×1 IMPLANT
UNDERPAD 30X36 HEAVY ABSORB (UNDERPADS AND DIAPERS) ×1 IMPLANT

## 2023-11-14 NOTE — Plan of Care (Signed)
  Problem: Pain Management: Goal: General experience of comfort will improve Outcome: Progressing   Problem: Safety: Goal: Ability to remain free from injury will improve Outcome: Progressing

## 2023-11-14 NOTE — Progress Notes (Addendum)
1341 patient brushed teeth then left floor by bed with or trasnsport for surgery alert x4 on room air patients father at bedside 1640 patient back to unit responds to name on room air follows commands parents at bedside asking for attending  1700 family report patient having trouble with breathing patient sleeping when RN entered room vitals obtain WNL patient refusing to sit up bead trended. Family states patient is cold warm blanket applied room temp set at max heat.

## 2023-11-14 NOTE — Progress Notes (Signed)
PROGRESS NOTE    Grant Curry  FIE:[id number] DOB: 04/07/1996 DOA: 11/10/2023 PCP: Pcp, No   Brief Narrative:  This 27 year old male with  history of hypertension, diabetes type 2, tobacco use disorder who presented to [facility] initially with complaint of groin pain/scrotal swelling on 12/4.  He was found to have sepsis secondary to Fournier's gangrene.  He was started on broad-spectrum antibiotics, analgesics.  Urology consulted.  Underwent surgical debridement of the scrotum and perineal area.  Patient transferred to [facility] for inpatient plastic surgery consultation for primary closure of the surgical scrotal wound. Plastic surgery planning for I&D and placement of tissue matrix on Monday.   12/8: Hemodynamically stable, going to the OR with plastic surgery tomorrow.  ID is recommending continuation of Zyvox as there is another possible isolate growing in culture.  Current cultures with pansensitive E. Coli.  12/09: scheduled to have excision and drainage of Fournier's gangrene of the scrotum and closure.  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  Found to have sepsis with Fournier's gangrene.   Status post scrotal exploration with surgical debridement on 12/4 and 12/5.  Currently on Zosyn and IV Zyvox.   Patient was transferred to [facility] for inpatient plastic surgery consultation.  Continue current antibiotics. Blood culture sent from [facility] has not shown any growth.   Wound culture sent from the OR is showing pan sensitive Ecoli. ID following Leucocytosis resolved. Plastic surgery planning for I&D and placement of tissue matrix today   Type 2 diabetes with hyperglycemia: Continue current insulin regimen.  Monitor blood sugars.  HbA1c of 10.6.   Looks like patient is not taking any medications at home.   He might need to be started on insulin.   Diabetic coordinator   consulted and following.   Tobacco use: Counseled cessation.   DVT prophylaxis: Heparin IV Code Status: Full Code Family Communication: No family at bedside Disposition Plan:   Status is: Inpatient Remains inpatient appropriate because: Admitted for plastic surgery evaluation for possible closure of fourneir's gangrene.  Consultants:  Plastic surgery ID  Procedures: Plastic Surgery  Antimicrobials: Anti-infectives (From admission, onward)    Start     Dose/Rate Route Frequency Ordered Stop   11/14/23 1430  ceFAZolin (ANCEF) IVPB 2g/100 mL premix        2 g 200 mL/hr over 30 Minutes Intravenous To ShortStay Surgical 11/14/23 1342 11/14/23 1455   11/13/23 1045  [MAR Hold]  linezolid (ZYVOX) tablet 600 mg        (MAR Hold since Mon 11/14/2023 at 1351.Hold Reason: Transfer to a Procedural area)   600 mg Oral Every 12 hours 11/13/23 0955     11/13/23 1045  [MAR Hold]  Ampicillin-Sulbactam (UNASYN) 3 g in sodium chloride 0.9 % 100 mL IVPB        (MAR Hold since Mon 11/14/2023 at 1351.Hold Reason: Transfer to a Procedural area)   3 g 200 mL/hr over 30 Minutes Intravenous Every 6 hours 11/13/23 0956     11/10/23 2200  linezolid (ZYVOX) IVPB 600 mg  Status:  Discontinued        600 mg 300 mL/hr over 60 Minutes Intravenous Every 12 hours 11/10/23 2032 11/13/23 0955   11/10/23 2200  piperacillin-tazobactam (ZOSYN) IVPB 3.375 g  Status:  Discontinued        3.375 g 12.5 mL/hr over 240 Minutes Intravenous Every 8 hours  11/10/23 2032 11/13/23 0955      Subjective: Patient was seen and examined at bedside.  Overnight events noted.   Patient reports frustration with wait for having this closure.   States he might sign AMA if that does not happen today.  Objective: Vitals:   11/14/23 0500 11/14/23 0533 11/14/23 0840 11/14/23 1353  BP:  (!) 147/87 137/87 (!) 168/92  Pulse:  72 73 71  Resp:  17 18 18   Temp:  97.9 F (36.6 C) 98 F (36.7 C) 97.9 F (36.6 C)  TempSrc:  Oral  Oral   SpO2:  100%  100%  Weight: 91.2 kg   83.9 kg  Height:    6' (1.829 m)    Intake/Output Summary (Last 24 hours) at 11/14/2023 1534 Last data filed at 11/14/2023 1200 Gross per 24 hour  Intake 520 ml  Output 1325 ml  Net -805 ml   Filed Weights   11/13/23 1601 11/14/23 0500 11/14/23 1353  Weight: 83.9 kg 91.2 kg 83.9 kg    Examination:  General exam: Appears calm and comfortable, not in any acute distress. Respiratory system: Clear to auscultation. Respiratory effort normal. RR 14 Cardiovascular system: S1 & S2 heard, RRR. No JVD, murmurs, rubs, gallops or clicks.  Gastrointestinal system: Abdomen is non distended, soft and non tender. Normal bowel sounds heard. Central nervous system: Alert and oriented x 3. No focal neurological deficits. Extremities: No edema, no cyanosis, no clubbing. Skin: No rashes, lesions or ulcers Psychiatry: Judgement and insight appear normal. Mood & affect appropriate.     Data Reviewed: I have personally reviewed following labs and imaging studies  CBC: Recent Labs  Lab 11/09/23 1203 11/09/23 1945 11/10/23 0549 11/10/23 2121 11/11/23 0608 11/12/23 0436  WBC 21.4* 14.7* 16.5* 13.9* 13.0* 8.7  NEUTROABS 18.3*  --   --   --   --   --   HGB 14.0 10.6* 11.5* 11.7* 10.7* 10.4*  HCT 40.2 30.7* 33.9* 34.3* 31.2* 30.9*  MCV 81.2 82.7 84.1 83.5 83.4 84.4  PLT 279 199 215 254 233 280   Basic Metabolic Panel: Recent Labs  Lab 11/09/23 1203 11/09/23 1945 11/10/23 0549 11/10/23 2121 11/11/23 0608  NA 127*  --  135  --  135  K 4.0  --  3.9  --  3.6  CL 90*  --  101  --  102  CO2 23  --  23  --  23  GLUCOSE 434*  --  210*  --  232*  BUN 18  --  17  --  17  CREATININE 1.05 0.84 1.11 1.14 0.99  CALCIUM 9.0  --  7.5*  --  7.9*   GFR: Estimated Creatinine Clearance: 121.9 mL/min (by C-G formula based on SCr of 0.99 mg/dL). Liver Function Tests: Recent Labs  Lab 11/09/23 1203 11/10/23 0549 11/11/23 0608  AST 19 18 16   ALT 28 25 27    ALKPHOS 101 70 80  BILITOT 1.7* 1.1 1.1  PROT 8.1 5.9* 5.8*  ALBUMIN 3.8 2.7* 2.5*   No results for input(s): "LIPASE", "AMYLASE" in the last 168 hours. No results for input(s): "AMMONIA" in the last 168 hours. Coagulation Profile: No results for input(s): "INR", "PROTIME" in the last 168 hours. Cardiac Enzymes: No results for input(s): "CKTOTAL", "CKMB", "CKMBINDEX", "TROPONINI" in the last 168 hours. BNP (last 3 results) No results for input(s): "PROBNP" in the last 8760 hours. HbA1C: No results for input(s): "HGBA1C" in the last 72 hours. CBG: Recent Labs  Lab 11/13/23 1736 11/13/23 2144 11/14/23 0803 11/14/23 1130 11/14/23 1358  GLUCAP 135* 106* 108* 111* 112*   Lipid Profile: No results for input(s): "CHOL", "HDL", "LDLCALC", "TRIG", "CHOLHDL", "LDLDIRECT" in the last 72 hours. Thyroid Function Tests: No results for input(s): "TSH", "T4TOTAL", "FREET4", "T3FREE", "THYROIDAB" in the last 72 hours. Anemia Panel: No results for input(s): "VITAMINB12", "FOLATE", "FERRITIN", "TIBC", "IRON", "RETICCTPCT" in the last 72 hours. Sepsis Labs: Recent Labs  Lab 11/09/23 1220 11/09/23 1405  LATICACIDVEN 3.1* 1.8    Recent Results (from the past 240 hour(s))  Chlamydia/NGC rt PCR ([facility] only)     Status: None   Collection Time: 11/09/23 10:01 AM   Specimen: Urine; GU  Result Value Ref Range Status   Specimen source GC/Chlam URINE, RANDOM  Final   Chlamydia Tr NOT DETECTED NOT DETECTED Final   N gonorrhoeae NOT DETECTED NOT DETECTED Final    Comment: (NOTE) This CT/NG assay has not been evaluated in patients with a history of  hysterectomy. Performed at [facility], [address] [place], [place] [address]   Culture, blood (single)     Status: None   Collection Time: 11/09/23  2:05 PM   Specimen: BLOOD RIGHT ARM  Result Value Ref Range Status   Specimen Description BLOOD RIGHT ARM  Final   Special Requests   Final    BOTTLES DRAWN AEROBIC AND ANAEROBIC  Blood Culture adequate volume   Culture   Final    NO GROWTH 5 DAYS Performed at [facility], [address], [place], [place] [address]    Report Status 11/14/2023 FINAL  Final  Aerobic/Anaerobic Culture w Gram Stain (surgical/deep wound)     Status: None (Preliminary result)   Collection Time: 11/09/23  5:07 PM   Specimen: Path Tissue  Result Value Ref Range Status   Specimen Description   Final    TISSUE Performed at [facility], [address] [place], [place] [address]    Special Requests SCROTAL TISSUE SPEC A  Final   Gram Stain   Final    RARE WBC PRESENT, PREDOMINANTLY MONONUCLEAR RARE GRAM POSITIVE COCCI IN PAIRS    Culture   Final    FEW ESCHERICHIA COLI FEW DIPHTHEROIDS(CORYNEBACTERIUM SPECIES) Standardized susceptibility testing for this organism is not available. HOLDING FOR POSSIBLE ANAEROBE Performed at [facility], [address], [place], [place] [address]    Report Status PENDING  Incomplete   Organism ID, Bacteria ESCHERICHIA COLI  Final      Susceptibility   Escherichia coli - MIC*    AMPICILLIN <=2 SENSITIVE Sensitive     CEFEPIME <=0.12 SENSITIVE Sensitive     CEFTAZIDIME <=1 SENSITIVE Sensitive     CEFTRIAXONE <=0.25 SENSITIVE Sensitive     CIPROFLOXACIN <=0.25 SENSITIVE Sensitive     GENTAMICIN <=1 SENSITIVE Sensitive     IMIPENEM <=0.25 SENSITIVE Sensitive     TRIMETH/SULFA <=20 SENSITIVE Sensitive     AMPICILLIN/SULBACTAM <=2 SENSITIVE Sensitive     PIP/TAZO <=4 SENSITIVE Sensitive ug/mL    * FEW ESCHERICHIA COLI    Radiology Studies: No results found.  Scheduled Meds:  [MAR Hold] Chlorhexidine Gluconate Cloth  6 each Topical Q0600   Chlorhexidine Gluconate Cloth  6 each Topical Once   And   Chlorhexidine Gluconate Cloth  6 each Topical Once   [MAR Hold] heparin  5,000 Units Subcutaneous Q8H   [MAR Hold] insulin aspart  0-9 Units Subcutaneous TID WC   [MAR Hold] insulin glargine-yfgn  12 Units Subcutaneous  Daily   [MAR Hold] linezolid  600 mg Oral Q12H   Continuous Infusions:  [MAR Hold] ampicillin-sulbactam (UNASYN) IV 3 g (11/14/23 1001)   lactated ringers 10 mL/hr at 11/14/23 1414     LOS: 4 days    Time spent: 50 mins    Willeen Niece, MD [facility]   If 7PM-7AM, please contact night-coverage

## 2023-11-14 NOTE — Plan of Care (Signed)
  Problem: Pain Management: Goal: General experience of comfort will improve 11/14/2023 2245 by Sammuel Cooper, RN Outcome: Progressing 11/14/2023 2029 by Sammuel Cooper, RN Outcome: Progressing   Problem: Safety: Goal: Ability to remain free from injury will improve 11/14/2023 2245 by Sammuel Cooper, RN Outcome: Progressing 11/14/2023 2029 by Sammuel Cooper, RN Outcome: Progressing

## 2023-11-14 NOTE — Anesthesia Postprocedure Evaluation (Signed)
Anesthesia Post Note  Patient: Grant Curry  Procedure(s) Performed: EXCISION AND DRAINAGE OF FORNIER'S GANGRENE OF SCROTUM AND CLOSURE (Groin)     Patient location during evaluation: PACU Anesthesia Type: General Level of consciousness: awake and alert Pain management: pain level controlled Vital Signs Assessment: post-procedure vital signs reviewed and stable Respiratory status: spontaneous breathing, nonlabored ventilation, respiratory function stable and patient connected to nasal cannula oxygen Cardiovascular status: blood pressure returned to baseline and stable Postop Assessment: no apparent nausea or vomiting Anesthetic complications: no   No notable events documented.  Last Vitals:  Vitals:   11/14/23 1615 11/14/23 1630  BP: (!) 132/90 131/88  Pulse: 77 67  Resp: 12 15  Temp:  (!) 36.2 C  SpO2: 94% 95%    Last Pain:  Vitals:   11/14/23 1630  TempSrc:   PainSc: Asleep                 Trevor Iha

## 2023-11-14 NOTE — Anesthesia Procedure Notes (Signed)
Procedure Name: Intubation Date/Time: 11/14/2023 2:54 PM  Performed by: Thomasene Ripple, CRNAPre-anesthesia Checklist: Patient identified, Emergency Drugs available, Suction available and Patient being monitored Patient Re-evaluated:Patient Re-evaluated prior to induction Oxygen Delivery Method: Circle System Utilized Preoxygenation: Pre-oxygenation with 100% oxygen Induction Type: IV induction Ventilation: Mask ventilation without difficulty Laryngoscope Size: Miller and 3 Grade View: Grade I Tube type: Oral Tube size: 7.5 mm Number of attempts: 1 Airway Equipment and Method: Stylet and Oral airway Placement Confirmation: ETT inserted through vocal cords under direct vision, positive ETCO2 and breath sounds checked- equal and bilateral Secured at: 23 cm Tube secured with: Tape Dental Injury: Teeth and Oropharynx as per pre-operative assessment

## 2023-11-14 NOTE — Op Note (Addendum)
DATE OF OPERATION: 11/14/2023  LOCATION: [facility] Operating Room Inpatient  PREOPERATIVE DIAGNOSIS: Fournier gangrene wound  POSTOPERATIVE DIAGNOSIS: Same  PROCEDURE:  Excision of fournier gangrene 5 x 20 cm skin, subcutaneous soft tissue Placement of Myriad powder 2 gm and 7 x 10 cm sheet Partial layered closure of 15 cm wound  SURGEON: Foster Simpson, DO  ASSISTANT: Caroline More, PA  EBL: 5 cc  CONDITION: Stable  COMPLICATIONS: None  INDICATION: The patient, Grant Curry, is a 27 y.o. male born on 05-Jul-1996, is here for treatment of fournier gangrene wound.   PROCEDURE DETAILS:  The patient was seen prior to surgery and marked.  The IV antibiotics were given. The patient was taken to the operating room and given a general anesthetic. A standard time out was performed and all information was confirmed by those in the room. SCDs were placed.   The patient was placed in stirups. He was prepped and draped.  The wound was irrigated with Vashe.  The #10 blade and tissue scissors were used to excise the skin and subcutaneous soft tissue that was not viable for the 5 x 20 cm wound.  Hemostasis was achieved with electrocautery. All of the myriad powder and sheet was used.  The sheet was secured with the 4-0 Vicryl.   The edges were brought together and approximated with the 3-0 Monocryl and then a 4-0 Monocryl was used to layer the closure.  The closure was the inferior 15 cm.  The sorbact was placed over the upper 10 cm and sutured in place with the 4-0 Vicryl.  Ky and sterile dressing was applied.    The patient was allowed to wake up and taken to recovery room in stable condition at the end of the case. The family was notified at the end of the case.   The advanced practice practitioner (APP) assisted throughout the case.  The APP was essential in retraction and counter traction when needed to make the case progress smoothly.  This retraction and assistance made it possible to see the  tissue plans for the procedure.  The assistance was needed for blood control, tissue re-approximation and assisted with closure of the incision site.

## 2023-11-14 NOTE — Transfer of Care (Signed)
Immediate Anesthesia Transfer of Care Note  Patient: Grant Curry  Procedure(s) Performed: EXCISION AND DRAINAGE OF FORNIER'S GANGRENE OF SCROTUM AND CLOSURE (Groin)  Patient Location: PACU  Anesthesia Type:General  Level of Consciousness: awake, alert , and oriented  Airway & Oxygen Therapy: Patient Spontanous Breathing and Patient connected to face mask oxygen  Post-op Assessment: Report given to RN and Post -op Vital signs reviewed and stable  Post vital signs: Reviewed and stable  Last Vitals:  Vitals Value Taken Time  BP 132/90 11/14/23 1615  Temp 36.2 C 11/14/23 1600  Pulse 67 11/14/23 1628  Resp 15 11/14/23 1628  SpO2 95 % 11/14/23 1628  Vitals shown include unfiled device data.  Last Pain:  Vitals:   11/14/23 1600  TempSrc:   PainSc: Asleep      Patients Stated Pain Goal: 0 (11/14/23 0800)  Complications: No notable events documented.

## 2023-11-14 NOTE — Anesthesia Preprocedure Evaluation (Addendum)
Anesthesia Evaluation  Patient identified by MRN, date of birth, ID band Patient awake    Reviewed: Allergy & Precautions, NPO status , Patient's Chart, lab work & pertinent test results  Airway Mallampati: II  TM Distance: >3 FB Neck ROM: Full    Dental  (+) Dental Advisory Given, Chipped,    Pulmonary Current Smoker and Patient abstained from smoking.   Pulmonary exam normal breath sounds clear to auscultation       Cardiovascular hypertension, Normal cardiovascular exam Rhythm:Regular Rate:Normal     Neuro/Psych negative neurological ROS  negative psych ROS   GI/Hepatic negative GI ROS, Neg liver ROS,,,  Endo/Other  diabetes, Type 2, Insulin Dependent    Renal/GU negative Renal ROS  negative genitourinary   Musculoskeletal negative musculoskeletal ROS (+)    Abdominal   Peds  Hematology negative hematology ROS (+)   Anesthesia Other Findings Fornier's gangrene  Reproductive/Obstetrics                             Anesthesia Physical Anesthesia Plan  ASA: 3  Anesthesia Plan: General   Post-op Pain Management: Tylenol PO (pre-op)* and Ketamine IV*   Induction: Intravenous  PONV Risk Score and Plan: 1 and Midazolam, Dexamethasone and Ondansetron  Airway Management Planned: Oral ETT  Additional Equipment:   Intra-op Plan:   Post-operative Plan: Extubation in OR  Informed Consent: I have reviewed the patients History and Physical, chart, labs and discussed the procedure including the risks, benefits and alternatives for the proposed anesthesia with the patient or authorized representative who has indicated his/her understanding and acceptance.     Dental advisory given  Plan Discussed with: CRNA  Anesthesia Plan Comments:        Anesthesia Quick Evaluation

## 2023-11-14 NOTE — Plan of Care (Signed)
  Problem: Education: Goal: Knowledge of General Education information will improve Description: Including pain rating scale, medication(s)/side effects and non-pharmacologic comfort measures Outcome: Progressing   Problem: Health Behavior/Discharge Planning: Goal: Ability to manage health-related needs will improve Outcome: Progressing   Problem: Clinical Measurements: Goal: Ability to maintain clinical measurements within normal limits will improve Outcome: Not Progressing Goal: Will remain free from infection Outcome: Not Progressing Goal: Diagnostic test results will improve Outcome: Progressing Goal: Respiratory complications will improve Outcome: Progressing Goal: Cardiovascular complication will be avoided Outcome: Progressing   Problem: Activity: Goal: Risk for activity intolerance will decrease Outcome: Progressing   Problem: Nutrition: Goal: Adequate nutrition will be maintained Outcome: Progressing   Problem: Coping: Goal: Level of anxiety will decrease Outcome: Progressing   Problem: Elimination: Goal: Will not experience complications related to bowel motility Outcome: Progressing Goal: Will not experience complications related to urinary retention Outcome: Progressing   Problem: Pain Management: Goal: General experience of comfort will improve Outcome: Progressing   Problem: Safety: Goal: Ability to remain free from injury will improve Outcome: Not Progressing   Problem: Skin Integrity: Goal: Risk for impaired skin integrity will decrease Outcome: Not Progressing   Problem: Education: Goal: Ability to describe self-care measures that may prevent or decrease complications (Diabetes Survival Skills Education) will improve Outcome: Progressing Goal: Individualized Educational Video(s) Outcome: Progressing   Problem: Coping: Goal: Ability to adjust to condition or change in health will improve Outcome: Progressing   Problem: Health  Behavior/Discharge Planning: Goal: Ability to identify and utilize available resources and services will improve Outcome: Progressing Goal: Ability to manage health-related needs will improve Outcome: Progressing   Problem: Metabolic: Goal: Ability to maintain appropriate glucose levels will improve Outcome: Progressing   Problem: Skin Integrity: Goal: Risk for impaired skin integrity will decrease Outcome: Progressing   Problem: Tissue Perfusion: Goal: Adequacy of tissue perfusion will improve Outcome: Progressing   Problem: Nutritional: Goal: Maintenance of adequate nutrition will improve Outcome: Progressing Goal: Progress toward achieving an optimal weight will improve Outcome: Progressing

## 2023-11-14 NOTE — Interval H&P Note (Signed)
History and Physical Interval Note:  11/14/2023 2:12 PM  Grant Curry  has presented today for surgery, with the diagnosis of FORNIER'S GANGRENE OF SCROTUM.  The various methods of treatment have been discussed with the patient and family. After consideration of risks, benefits and other options for treatment, the patient has consented to  Procedure(s): EXCISION AND DRAINAGE OF FORNIER'S GANGRENE OF SCROTUM AND CLOSURE (N/A) as a surgical intervention.  The patient's history has been reviewed, patient examined, no change in status, stable for surgery.  I have reviewed the patient's chart and labs.  Questions were answered to the patient's satisfaction.     Alena Bills Deborah Lazcano

## 2023-11-15 ENCOUNTER — Other Ambulatory Visit (HOSPITAL_COMMUNITY): Payer: Self-pay

## 2023-11-15 ENCOUNTER — Encounter (HOSPITAL_COMMUNITY): Payer: Self-pay | Admitting: Plastic Surgery

## 2023-11-15 LAB — CBC
HCT: 37.2 % — ABNORMAL LOW (ref 39.0–52.0)
Hemoglobin: 12.3 g/dL — ABNORMAL LOW (ref 13.0–17.0)
MCH: 28 pg (ref 26.0–34.0)
MCHC: 33.1 g/dL (ref 30.0–36.0)
MCV: 84.5 fL (ref 80.0–100.0)
Platelets: 377 10*3/uL (ref 150–400)
RBC: 4.4 MIL/uL (ref 4.22–5.81)
RDW: 12.7 % (ref 11.5–15.5)
WBC: 9.4 10*3/uL (ref 4.0–10.5)
nRBC: 0 % (ref 0.0–0.2)

## 2023-11-15 LAB — COMPREHENSIVE METABOLIC PANEL
ALT: 34 U/L (ref 0–44)
AST: 21 U/L (ref 15–41)
Albumin: 2.5 g/dL — ABNORMAL LOW (ref 3.5–5.0)
Alkaline Phosphatase: 73 U/L (ref 38–126)
Anion gap: 15 (ref 5–15)
BUN: 12 mg/dL (ref 6–20)
CO2: 22 mmol/L (ref 22–32)
Calcium: 9 mg/dL (ref 8.9–10.3)
Chloride: 100 mmol/L (ref 98–111)
Creatinine, Ser: 0.99 mg/dL (ref 0.61–1.24)
GFR, Estimated: >60 mL/min (ref >60)
Glucose, Bld: 211 mg/dL — ABNORMAL HIGH (ref 70–99)
Potassium: 4.4 mmol/L (ref 3.5–5.1)
Sodium: 137 mmol/L (ref 135–145)
Total Bilirubin: 1.1 mg/dL (ref <1.2)
Total Protein: 6.2 g/dL — ABNORMAL LOW (ref 6.5–8.1)

## 2023-11-15 LAB — PHOSPHORUS: Phosphorus: 3.3 mg/dL (ref 2.5–4.6)

## 2023-11-15 LAB — GLUCOSE, CAPILLARY
Glucose-Capillary: 180 mg/dL — ABNORMAL HIGH (ref 70–99)
Glucose-Capillary: 211 mg/dL — ABNORMAL HIGH (ref 70–99)

## 2023-11-15 LAB — MAGNESIUM: Magnesium: 1.4 mg/dL — ABNORMAL LOW (ref 1.7–2.4)

## 2023-11-15 MED ORDER — MELATONIN 5 MG PO TABS
5.0000 mg | ORAL_TABLET | Freq: Once | ORAL | Status: AC
Start: 2023-11-15 — End: 2023-11-15
  Administered 2023-11-15: 5 mg via ORAL
  Filled 2023-11-15: qty 1

## 2023-11-15 MED ORDER — LINEZOLID 600 MG PO TABS
600.0000 mg | ORAL_TABLET | Freq: Two times a day (BID) | ORAL | 0 refills | Status: DC
  Filled 2023-11-15: qty 20, 10d supply, fill #0

## 2023-11-15 MED ORDER — AMOXICILLIN-POT CLAVULANATE 875-125 MG PO TABS
1.0000 | ORAL_TABLET | Freq: Two times a day (BID) | ORAL | 0 refills | Status: AC
  Filled 2023-11-15: qty 20, 10d supply, fill #0

## 2023-11-15 MED ORDER — MAGNESIUM SULFATE 2 GM/50ML IV SOLN
2.0000 g | Freq: Once | INTRAVENOUS | Status: AC
  Administered 2023-11-15: 2 g via INTRAVENOUS
  Filled 2023-11-15: qty 50

## 2023-11-15 MED ORDER — OXYCODONE HCL 5 MG PO TABS
5.0000 mg | ORAL_TABLET | ORAL | 0 refills | Status: AC | PRN
  Filled 2023-11-15: qty 6, 1d supply, fill #0

## 2023-11-15 MED ORDER — AMOXICILLIN-POT CLAVULANATE 875-125 MG PO TABS: 1.0000 | ORAL_TABLET | Freq: Two times a day (BID) | ORAL | Status: DC

## 2023-11-15 NOTE — TOC Progression Note (Addendum)
Transition of Care (TOC) - Progression Note   Spoke to patient and his mother Dorene Grebe via speaker phone.   In morning meeting NCM was told parents want a home health RN .   Patient does not have insurance, signed documents for Medicaid last week.   Also patient does not have PCP . Last week patient's father said the plan was for patient to establish care at [facility].   [facility] is home health agency that is assisting patients with no insurance this week.   [facility] can accept the patient for home health RN , if Plastics agree to provide order and follow wound care post discharge, since patient does not have a PCP.   NCM secure chatted Dr Ulice Bold and Evelena Leyden PA . Await response   [facility] H[facility] will visit once a week to check wound. Patient and family will need to be taught wound care prior to discharge.   Above discussed with patient and his mother. His mother stated they have not called to schedule an appointment to establish care with [place] , because they did not know when patient is discharging . NCM encouraged her to call ASAP because sometimes it can take awhile to schedule a new patient appointment.  Patient's mother will call [facility] today to schedule an appointment. Mother is willing to be taught wound care . NCM explained bedside nurse will provide education.   Patient and mother voiced understanding of above   Plastics have agreed to follow for home health   Patient has an appointment at [facility] to establish care 11/23/23  Patient Details  Name: Grant Curry MRN: [id number] Date of Birth: [birth year]  Transition of Care [facility]) CM/SW Contact  Jenavi Beedle, Adria Devon, RN Phone Number: 11/15/2023, 11:33 AM  Clinical Narrative:       Expected Discharge Plan: Home/Self Care Barriers to Discharge: Continued Medical Work up  Expected Discharge Plan and Services In-house Referral: [facility] Discharge Planning Services: CM Consult Post  Acute Care Choice: NA Living arrangements for the past 2 months: Single Family Home                 DME Arranged: N/A DME Agency: NA       HH Arranged: NA           Social Determinants of Health (SDOH) Interventions SDOH Screenings   Food Insecurity: No Food Insecurity (11/12/2023)  Housing: Patient Unable To Answer (11/12/2023)  Transportation Needs: No Transportation Needs (11/12/2023)  Utilities: Not At Risk (11/12/2023)  Tobacco Use: High Risk (11/14/2023)    Readmission Risk Interventions     No data to display

## 2023-11-15 NOTE — Plan of Care (Signed)

## 2023-11-15 NOTE — Discharge Instructions (Signed)
Advised to follow-up with plastic surgery Dr. Ulice Bold in 1 week. Advised to take linezolid 600 mg twice daily and Augmentin 875 twice daily for 10 days.

## 2023-11-15 NOTE — Progress Notes (Signed)
[facility]  Date of Admission:  11/10/2023     Total days of antibiotics 7         ASSESSMENT:  Grant Curry is POD #1 from excision and drainage of scrotum and closure in the setting of Fornier's gangrene. Discussed plan of care for 10 days of antibiotics from the time of surgery with linezolid and amoxicillin/clavulanate with continued wound care per Plastic Surgery. Continue current vancomycin and ampicillin/sulbactam until discharge. Emphasized importance of tobacco cessation and blood sugar control to reduce risk of complicated healing and risk of infection in the future. Ok for discharge from ID standpoint. Remaining medical and supportive care per Internal Medicine.  PLAN:  Continue current dose of vancomycin and ampicillin/sulbactam while inpatient and transition to linezolid 600 mg PO bid and amoxicillin/clavulanate 875/125 mg PO bid at discharge with duration of 10 days from surgery.  Post-operative wound care per Plastic Surgery. Ok for discharge from ID standpoint. Remaining medical and supportive care per Internal Medicine.     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 Asking when he can go home.   No Known Allergies   Review of Systems: Review of Systems  Constitutional:  Negative for chills, fever and weight loss.  Respiratory:  Negative for cough, shortness of breath and wheezing.   Cardiovascular:  Negative for chest pain and leg swelling.  Gastrointestinal:  Negative for abdominal pain, constipation, diarrhea, nausea and vomiting.  Skin:  Negative for rash.      OBJECTIVE: Vitals:   11/15/23 0415 11/15/23 0805 11/15/23 1125 11/15/23  1157  BP:  (!) 145/86  (!) 143/93  Pulse:  72 83 83  Resp:  17  16  Temp:  98.2 F (36.8 C)  97.7 F (36.5 C)  TempSrc:  Oral  Oral  SpO2:  100%  100%  Weight: 89.1 kg     Height:       Body mass index is 26.64 kg/m.  Physical Exam Constitutional:      General: He is not in acute distress.    Appearance: He is well-developed.  Cardiovascular:     Rate and Rhythm: Normal rate and regular rhythm.     Heart sounds: Normal heart sounds.  Pulmonary:     Effort: Pulmonary effort is normal.     Breath sounds: Normal breath sounds.  Skin:    General: Skin is warm and dry.  Neurological:     Mental Status: He is alert.     Lab Results Lab Results  Component Value Date   WBC 9.4 11/15/2023   HGB 12.3 (L) 11/15/2023   HCT 37.2 (L) 11/15/2023   MCV 84.5 11/15/2023   PLT 377 11/15/2023    Lab Results  Component Value Date   CREATININE 0.99 11/15/2023   BUN 12 11/15/2023   NA 137 11/15/2023   K 4.4 11/15/2023   CL 100 11/15/2023   CO2 22 11/15/2023    Lab Results  Component Value Date   ALT 34 11/15/2023   AST 21 11/15/2023   ALKPHOS 73 11/15/2023   BILITOT 1.1 11/15/2023     Microbiology: Recent Results (from the  past 240 hour(s))  Chlamydia/NGC rt PCR ([facility] only)     Status: None   Collection Time: 11/09/23 10:01 AM   Specimen: Urine; GU  Result Value Ref Range Status   Specimen source GC/Chlam URINE, RANDOM  Final   Chlamydia Tr NOT DETECTED NOT DETECTED Final   N gonorrhoeae NOT DETECTED NOT DETECTED Final    Comment: (NOTE) This CT/NG assay has not been evaluated in patients with a history of  hysterectomy. Performed at [facility], [address] [place], [place] [address]   Culture, blood (single)     Status: None   Collection Time: 11/09/23  2:05 PM   Specimen: BLOOD RIGHT ARM  Result Value Ref Range Status   Specimen Description BLOOD RIGHT ARM  Final   Special Requests   Final    BOTTLES DRAWN AEROBIC AND ANAEROBIC Blood Culture  adequate volume   Culture   Final    NO GROWTH 5 DAYS Performed at [facility], [address], [place], [place] [address]    Report Status 11/14/2023 FINAL  Final  Aerobic/Anaerobic Culture w Gram Stain (surgical/deep wound)     Status: None   Collection Time: 11/09/23  5:07 PM   Specimen: Path Tissue  Result Value Ref Range Status   Specimen Description   Final    TISSUE Performed at [facility], [address], [place], [place] [address]    Special Requests SCROTAL TISSUE SPEC A  Final   Gram Stain   Final    RARE WBC PRESENT, PREDOMINANTLY MONONUCLEAR RARE GRAM POSITIVE COCCI IN PAIRS    Culture   Final    FEW ESCHERICHIA COLI FEW DIPHTHEROIDS(CORYNEBACTERIUM SPECIES) Standardized susceptibility testing for this organism is not available. MODERATE BACTEROIDES SPECIES NOT FRAGILIS BETA LACTAMASE POSITIVE Performed at [facility], [address] [place], [place] [address]    Report Status 11/14/2023 FINAL  Final   Organism ID, Bacteria ESCHERICHIA COLI  Final      Susceptibility   Escherichia coli - MIC*    AMPICILLIN <=2 SENSITIVE Sensitive     CEFEPIME <=0.12 SENSITIVE Sensitive     CEFTAZIDIME <=1 SENSITIVE Sensitive     CEFTRIAXONE <=0.25 SENSITIVE Sensitive     CIPROFLOXACIN <=0.25 SENSITIVE Sensitive     GENTAMICIN <=1 SENSITIVE Sensitive     IMIPENEM <=0.25 SENSITIVE Sensitive     TRIMETH/SULFA <=20 SENSITIVE Sensitive     AMPICILLIN/SULBACTAM <=2 SENSITIVE Sensitive     PIP/TAZO <=4 SENSITIVE Sensitive ug/mL    * FEW ESCHERICHIA COLI     Marcos Eke, NP [facility]  11/15/2023  12:34 PM

## 2023-11-15 NOTE — Progress Notes (Signed)
1 Day Post-Op  Subjective: Patient is a 27 year old male with history of Fournier's gangrene.  He underwent excision of wound, placement of myriad and partial layered closure with Dr. Ulice Bold yesterday, 11/14/2023.  He is postop day 1 today.  Today, patient reports he is doing well.  He denies any acute events overnight.  Denies any significant pain to his wound.  Objective: Vital signs in last 24 hours: Temp:  [97.1 F (36.2 C)-98.2 F (36.8 C)] 98.2 F (36.8 C) (12/10 0805) Pulse Rate:  [62-88] 72 (12/10 0805) Resp:  [10-20] 17 (12/10 0805) BP: (131-168)/(85-98) 145/86 (12/10 0805) SpO2:  [94 %-100 %] 100 % (12/10 0805) Weight:  [83.9 kg-89.1 kg] 89.1 kg (12/10 0415) Last BM Date : 11/09/23  Intake/Output from previous day: 12/09 0701 - 12/10 0700 In: 800 [I.V.:700; IV Piggyback:100] Out: 1703 [Urine:1700; Blood:3] Intake/Output this shift: Total I/O In: 120 [P.O.:120] Out: 600 [Urine:600]  General appearance: alert, cooperative, and no distress Resp: Unlabored breathing, no respiratory distress Male genitalia: Sorbact in place over perineal wound.  It appears to be well-approximated, no dehiscence noted on exam.  No signs of infection on exam.  Lab Results:     Latest Ref Rng & Units 11/15/2023    6:09 AM 11/12/2023    4:36 AM 11/11/2023    6:08 AM  CBC  WBC 4.0 - 10.5 K/uL 9.4  8.7  13.0   Hemoglobin 13.0 - 17.0 g/dL 02.7  25.3  66.4   Hematocrit 39.0 - 52.0 % 37.2  30.9  31.2   Platelets 150 - 400 K/uL 377  280  233     BMET Recent Labs    11/15/23 0609  NA 137  K 4.4  CL 100  CO2 22  GLUCOSE 211*  BUN 12  CREATININE 0.99  CALCIUM 9.0   PT/INR No results for input(s): "LABPROT", "INR" in the last 72 hours. ABG No results for input(s): "PHART", "HCO3" in the last 72 hours.  Invalid input(s): "PCO2", "PO2"  Studies/Results: No results found.  Anti-infectives: Anti-infectives (From admission, onward)    Start     Dose/Rate Route Frequency  Ordered Stop   11/14/23 1430  ceFAZolin (ANCEF) IVPB 2g/100 mL premix        2 g 200 mL/hr over 30 Minutes Intravenous To [facility] 11/14/23 1342 11/14/23 1455   11/13/23 1045  linezolid (ZYVOX) tablet 600 mg        600 mg Oral Every 12 hours 11/13/23 0955     11/13/23 1045  Ampicillin-Sulbactam (UNASYN) 3 g in sodium chloride 0.9 % 100 mL IVPB        3 g 200 mL/hr over 30 Minutes Intravenous Every 6 hours 11/13/23 0956     11/10/23 2200  linezolid (ZYVOX) IVPB 600 mg  Status:  Discontinued        600 mg 300 mL/hr over 60 Minutes Intravenous Every 12 hours 11/10/23 2032 11/13/23 0955   11/10/23 2200  piperacillin-tazobactam (ZOSYN) IVPB 3.375 g  Status:  Discontinued        3.375 g 12.5 mL/hr over 240 Minutes Intravenous Every 8 hours 11/10/23 2032 11/13/23 0955       Assessment/Plan: s/p Procedure(s): EXCISION AND DRAINAGE OF FORNIER'S GANGRENE OF SCROTUM AND CLOSURE Plan: -Patient doing well postop day 1.  -Wound care: Apply a small amount of K-Y jelly to green Sorbact and apply gauze and ABD pad daily. -Discussed with patient that he will need to follow-up with our clinic next week.  Discussed with him that there is a possibility of needing to return to the OR within the next 1 to 2 weeks.  Patient expressed understanding. -Ok to discharge from plastic surgery standpoint  LOS: 5 days    Laurena Spies, PA-C 11/15/2023

## 2023-11-15 NOTE — Discharge Summary (Signed)
Physician Discharge Summary  Grant Curry UUV:[id number] DOB: 1996/02/22 DOA: 11/10/2023  PCP: Pcp, No  Admit date: 11/10/2023  Discharge date: 11/15/2023  Admitted From: Home  Disposition:  Home.  Recommendations for Outpatient Follow-up:  Follow up with PCP in 1-2 weeks. Please obtain BMP/CBC in one week. Advised to follow-up with plastic surgery Dr. Ulice Bold in 1 week. Advised to take linezolid 600 mg twice daily and Augmentin 875 twice daily for 10 days.  Home Health: NONE Equipment/Devices: None  Discharge Condition: Stable CODE STATUS:Full code Diet recommendation: Heart Healthy  Brief [facility] Course: This 27 year old male with  history of hypertension, diabetes type 2, tobacco use disorder who presented to [facility] initially with complaint of groin pain/scrotal swelling on 11/09/23.  He was found to have sepsis secondary to Fournier's gangrene.  He was started on broad-spectrum antibiotics, analgesics.  Urology consulted.  Underwent surgical debridement of the scrotum and perineal area.  Patient transferred to [facility] for inpatient plastic surgery consultation for primary closure of the surgical scrotal wound. Plastic surgery evaluated the patient and advised I&D and placement of tissue matrix on Monday. ID is recommending continuation of Zyvox as there is another possible isolate growing in culture.  Current cultures with pansensitive E. Coli. Patient underwent excision and drainage of Fournier's gangrene of the scrotum and closure.  Patient wanted to be discharged.  Infectious disease recommended patient can be discharged on linezolid 600 mg twice daily and Augmentin 875 twice daily for 10 days.  Patient will follow-up with Dr. Ulice Bold in 1 week.  Discharge Diagnoses:  Principal Problem:   Fournier gangrene in male  Fournier's gangrene:  Initially presented with groin pain / scrotal swelling.  Found to have sepsis with Fournier's gangrene.   Status post  scrotal exploration with surgical debridement on 12/4 and 12/5.  Currently on Zosyn and IV Zyvox.   Patient was transferred to [facility] for inpatient plastic surgery consultation.  Continue current antibiotics. Blood culture sent from [facility] has not shown any growth.   Wound culture sent from the OR is showing pan sensitive Ecoli. ID following Leucocytosis resolved. Patient underwent excision and drainage of Fournier's gangrene of the scrotum and closure. ID recommended can be discharged on linezolid 600 mg twice daily and Augmentin 875 twice daily for 10 days.   Advised outpatient follow-up with Dr. Ulice Bold in 1 week.   Type 2 diabetes with hyperglycemia: Continue current insulin regimen.  Monitor blood sugars.  HbA1c of 10.6.   Looks like patient is not taking any medications at home.   Patient wants to follow-up with primary care physician for resumption of insulin. Diabetic coordinator  consulted and following.   Tobacco use: Counseled cessation.    Discharge Instructions  Discharge Instructions     Call MD for:  difficulty breathing, headache or visual disturbances   Complete by: As directed    Call MD for:  severe uncontrolled pain   Complete by: As directed    Call MD for:  temperature >100.4   Complete by: As directed    Diet - low sodium heart healthy   Complete by: As directed    Diet Carb Modified   Complete by: As directed    Discharge instructions   Complete by: As directed    Follow-up with primary care physician in 1 week. Advised to follow-up with plastic surgery Dr. Ulice Bold in 1 week. Advised to take linezolid 600 mg twice daily and Augmentin 875 twice daily for 10 days.  Discharge wound care:   Complete by: As directed    Follow up Plastic surgery   Increase activity slowly   Complete by: As directed       Allergies as of 11/15/2023   No Known Allergies      Medication List     STOP taking these medications    insulin glargine-yfgn 100 UNIT/ML  injection Commonly known as: SEMGLEE   linezolid 600 MG/300ML IVPB Commonly known as: ZYVOX Replaced by: linezolid 600 MG tablet   piperacillin-tazobactam 3.375 GM/50ML IVPB Commonly known as: ZOSYN       TAKE these medications    amoxicillin-clavulanate 875-125 MG tablet Commonly known as: AUGMENTIN Take 1 tablet by mouth every 12 (twelve) hours for 10 days.   linezolid 600 MG tablet Commonly known as: ZYVOX Take 1 tablet (600 mg total) by mouth every 12 (twelve) hours for 10 days. Replaces: linezolid 600 MG/300ML IVPB   oxyCODONE 5 MG immediate release tablet Commonly known as: Oxy IR/ROXICODONE Take 1 tablet (5 mg total) by mouth every 4 (four) hours as needed for up to 3 days for moderate pain (pain score 4-6).               Discharge Care Instructions  (From admission, onward)           Start     Ordered   11/15/23 0000  Discharge wound care:       Comments: Follow up Plastic surgery   11/15/23 1537            Follow-up Information     Dillingham, Alena Bills, DO Follow up in 2 week(s).   Specialty: Plastic Surgery Contact information: [address] [place] [address] [phone]         [facility] Follow up.   Contact information: [address] [place] [address] [phone]         [facility] Follow up in 1 week(s).   Contact information: [address] [place] [address] [phone]                No Known Allergies  Consultations: Infectious diseases Plastic surgery   Procedures/Studies: CT ABDOMEN PELVIS W CONTRAST  Result Date: 11/09/2023 CLINICAL DATA:  Fournier's gangrene.  Left scrotal swelling. EXAM: CT ABDOMEN AND PELVIS WITH CONTRAST TECHNIQUE: Multidetector CT imaging of the abdomen and pelvis was performed using the standard protocol following bolus administration of intravenous contrast. RADIATION DOSE REDUCTION: This exam was  performed according to the departmental dose-optimization program which includes automated exposure control, adjustment of the mA and/or kV according to patient size and/or use of iterative reconstruction technique. CONTRAST:  OMNIPAQUE IOHEXOL 300 MG/ML  SOLN COMPARISON:  Ultrasound scrotum and Doppler exam from earlier the same day. FINDINGS: Lower chest: There is a 6.3 x 8.2 mm solid noncalcified nodule in the right lung lower lobe abutting the pleura. Please see follow-up recommendations below. The lung bases are otherwise clear. No pleural effusion. The heart is normal in size. No pericardial effusion. Hepatobiliary: The liver is normal in size. Non-cirrhotic configuration. No suspicious mass. There is a geographic, small, hypoattenuating area along the falciform ligament attachment site, which even though incompletely characterized on the current examination, favored to represent focal fatty infiltration. Attention on follow-up examination is recommended. No intrahepatic or extrahepatic bile duct dilation. Small volume dependent gallstones/sludge noted without imaging signs of acute cholecystitis. Normal gallbladder wall thickness. No pericholecystic inflammatory  changes. Pancreas: Unremarkable. No pancreatic ductal dilatation or surrounding inflammatory changes. Spleen: Within normal limits. No focal lesion. Adrenals/Urinary Tract: Adrenal glands are unremarkable. No suspicious renal mass. No hydronephrosis. No renal or ureteric calculi. Unremarkable urinary bladder. Stomach/Bowel: No disproportionate dilation of the small or large bowel loops. No evidence of abnormal bowel wall thickening or inflammatory changes. The appendix is unremarkable. Vascular/Lymphatic: No ascites or pneumoperitoneum. No abdominal or pelvic lymphadenopathy, by size criteria. No aneurysmal dilation of the major abdominal arteries. Reproductive: Normal-sized prostate gland. Symmetric bilateral seminal vesicles. Along the left  side of the scrotum, there is diffuse hypoattenuating area. The internal attenuation of the area is 20-22 Hounsfield units. There is no discrete peripheral wall. There is small amount of such area along the superior anterior aspect of the scrotum also, surrounding both testicles. No evidence of air within the soft tissue. Findings may represent diffuse scrotal wall edema/cellulitis without frank abscess. Other: There is a tiny fat containing umbilical hernia. The soft tissues and abdominal wall are otherwise unremarkable. Musculoskeletal: No suspicious osseous lesions. There are mild multilevel degenerative changes in the visualized spine. IMPRESSION: *There are low-attenuation areas predominantly along the left side of the scrotum which also extends surrounding both testicles. No evidence of frank walled-off abscess or air within the soft tissue. Findings may represent diffuse scrotal wall edema/cellulitis without frank abscess. *Multiple other nonacute observations, as described above. Electronically Signed   By: Jules Schick M.D.   On: 11/09/2023 16:54   US SCROTUM W/DOPPLER  Result Date: 11/09/2023 CLINICAL DATA:  [id number] Swelling of left half of scrotum [id number] EXAM: SCROTAL ULTRASOUND DOPPLER ULTRASOUND OF THE TESTICLES TECHNIQUE: Complete ultrasound examination of the testicles, epididymis, and other scrotal structures was performed. Color and spectral Doppler ultrasound were also utilized to evaluate blood flow to the testicles. COMPARISON:  None Available. FINDINGS: Right testicle Measurements: 2.0 x 2.9 x 4.8 cm. No mass or microlithiasis visualized. Left testicle Measurements: 2.0 x 2.8 x 4.4 cm. No mass or microlithiasis visualized. Right epididymis:  Normal in size and appearance. Left epididymis:  Normal in size and appearance. Hydrocele:  None visualized. Varicocele:  None visualized. Pulsed Doppler interrogation of both testes demonstrates normal low resistance arterial and venous waveforms  bilaterally. There is diffuse asymmetric thickening along the left scrotal wall, as seen on the provided images. There are heterogeneous hypoechoic areas; however, no discrete drainable abscess or collection. IMPRESSION: *There is diffuse asymmetric thickening along the left scrotal wall, as seen on the provided images. There are heterogeneous hypoechoic areas; however, no discrete drainable abscess or collection. Findings may represent subcutaneous edema. Correlate clinically. *Otherwise unremarkable exam. Electronically Signed   By: Jules Schick M.D.   On: 11/09/2023 15:21    Subjective: Patient was seen and examined at bedside.  Overnight events noted.   Patient reports  doing much better and wants to be discharged.  Patient being discharged home.  Discharge Exam: Vitals:   11/15/23 1157 11/15/23 1157  BP: (!) 143/93 (!) 147/93  Pulse: 83 81  Resp: 16   Temp: 97.7 F (36.5 C) 97.7 F (36.5 C)  SpO2: 100% 100%   Vitals:   11/15/23 0805 11/15/23 1125 11/15/23 1157 11/15/23 1157  BP: (!) 145/86  (!) 143/93 (!) 147/93  Pulse: 72 83 83 81  Resp: 17  16   Temp: 98.2 F (36.8 C)  97.7 F (36.5 C) 97.7 F (36.5 C)  TempSrc: Oral  Oral Oral  SpO2: 100%  100% 100%  Weight:  Height:        General: Pt is alert, awake, not in acute distress Cardiovascular: RRR, S1/S2 +, no rubs, no gallops Respiratory: CTA bilaterally, no wheezing, no rhonchi Abdominal: Soft, NT, ND, bowel sounds + Extremities: no edema, no cyanosis    The results of significant diagnostics from this hospitalization (including imaging, microbiology, ancillary and laboratory) are listed below for reference.     Microbiology: Recent Results (from the past 240 hour(s))  Chlamydia/NGC rt PCR ([facility] only)     Status: None   Collection Time: 11/09/23 10:01 AM   Specimen: Urine; GU  Result Value Ref Range Status   Specimen source GC/Chlam URINE, RANDOM  Final   Chlamydia Tr NOT DETECTED NOT DETECTED Final   N  gonorrhoeae NOT DETECTED NOT DETECTED Final    Comment: (NOTE) This CT/NG assay has not been evaluated in patients with a history of  hysterectomy. Performed at [facility], [address] [place], [place] [address]   Culture, blood (single)     Status: None   Collection Time: 11/09/23  2:05 PM   Specimen: BLOOD RIGHT ARM  Result Value Ref Range Status   Specimen Description BLOOD RIGHT ARM  Final   Special Requests   Final    BOTTLES DRAWN AEROBIC AND ANAEROBIC Blood Culture adequate volume   Culture   Final    NO GROWTH 5 DAYS Performed at [facility], [address] [place], [place] [address]    Report Status 11/14/2023 FINAL  Final  Aerobic/Anaerobic Culture w Gram Stain (surgical/deep wound)     Status: None   Collection Time: 11/09/23  5:07 PM   Specimen: Path Tissue  Result Value Ref Range Status   Specimen Description   Final    TISSUE Performed at [facility], [address] [place], [place] [address]    Special Requests SCROTAL TISSUE SPEC A  Final   Gram Stain   Final    RARE WBC PRESENT, PREDOMINANTLY MONONUCLEAR RARE GRAM POSITIVE COCCI IN PAIRS    Culture   Final    FEW ESCHERICHIA COLI FEW DIPHTHEROIDS(CORYNEBACTERIUM SPECIES) Standardized susceptibility testing for this organism is not available. MODERATE BACTEROIDES SPECIES NOT FRAGILIS BETA LACTAMASE POSITIVE Performed at [facility], [address], [place], [place] [address]    Report Status 11/14/2023 FINAL  Final   Organism ID, Bacteria ESCHERICHIA COLI  Final      Susceptibility   Escherichia coli - MIC*    AMPICILLIN <=2 SENSITIVE Sensitive     CEFEPIME <=0.12 SENSITIVE Sensitive     CEFTAZIDIME <=1 SENSITIVE Sensitive     CEFTRIAXONE <=0.25 SENSITIVE Sensitive     CIPROFLOXACIN <=0.25 SENSITIVE Sensitive     GENTAMICIN <=1 SENSITIVE Sensitive     IMIPENEM <=0.25 SENSITIVE Sensitive     TRIMETH/SULFA <=20 SENSITIVE Sensitive     AMPICILLIN/SULBACTAM  <=2 SENSITIVE Sensitive     PIP/TAZO <=4 SENSITIVE Sensitive ug/mL    * FEW ESCHERICHIA COLI     Labs: BNP (last 3 results) No results for input(s): "BNP" in the last 8760 hours. Basic Metabolic Panel: Recent Labs  Lab 11/09/23 1203 11/09/23 1945 11/10/23 0549 11/10/23 2121 11/11/23 0608 11/15/23 0609  NA 127*  --  135  --  135 137  K 4.0  --  3.9  --  3.6 4.4  CL 90*  --  101  --  102 100  CO2 23  --  23  --  23 22  GLUCOSE 434*  --  210*  --  232* 211*  BUN 18  --  17  --  17 12  CREATININE 1.05 0.84 1.11 1.14 0.99 0.99  CALCIUM 9.0  --  7.5*  --  7.9* 9.0  MG  --   --   --   --   --  1.4*  PHOS  --   --   --   --   --  3.3   Liver Function Tests: Recent Labs  Lab 11/09/23 1203 11/10/23 0549 11/11/23 0608 11/15/23 0609  AST 19 18 16 21   ALT 28 25 27  34  ALKPHOS 101 70 80 73  BILITOT 1.7* 1.1 1.1 1.1  PROT 8.1 5.9* 5.8* 6.2*  ALBUMIN 3.8 2.7* 2.5* 2.5*   No results for input(s): "LIPASE", "AMYLASE" in the last 168 hours. No results for input(s): "AMMONIA" in the last 168 hours. CBC: Recent Labs  Lab 11/09/23 1203 11/09/23 1945 11/10/23 0549 11/10/23 2121 11/11/23 0608 11/12/23 0436 11/15/23 0609  WBC 21.4*   < > 16.5* 13.9* 13.0* 8.7 9.4  NEUTROABS 18.3*  --   --   --   --   --   --   HGB 14.0   < > 11.5* 11.7* 10.7* 10.4* 12.3*  HCT 40.2   < > 33.9* 34.3* 31.2* 30.9* 37.2*  MCV 81.2   < > 84.1 83.5 83.4 84.4 84.5  PLT 279   < > 215 254 233 280 377   < > = values in this interval not displayed.   Cardiac Enzymes: No results for input(s): "CKTOTAL", "CKMB", "CKMBINDEX", "TROPONINI" in the last 168 hours. BNP: Invalid input(s): "POCBNP" CBG: Recent Labs  Lab 11/14/23 1602 11/14/23 1757 11/14/23 2023 11/15/23 0806 11/15/23 1213  GLUCAP 139* 176* 171* 180* 211*   D-Dimer No results for input(s): "DDIMER" in the last 72 hours. Hgb A1c No results for input(s): "HGBA1C" in the last 72 hours. Lipid Profile No results for input(s): "CHOL",  "HDL", "LDLCALC", "TRIG", "CHOLHDL", "LDLDIRECT" in the last 72 hours. Thyroid function studies No results for input(s): "TSH", "T4TOTAL", "T3FREE", "THYROIDAB" in the last 72 hours.  Invalid input(s): "FREET3" Anemia work up No results for input(s): "VITAMINB12", "FOLATE", "FERRITIN", "TIBC", "IRON", "RETICCTPCT" in the last 72 hours. Urinalysis    Component Value Date/Time   COLORURINE YELLOW (A) 11/09/2023 1001   APPEARANCEUR CLEAR (A) 11/09/2023 1001   LABSPEC 1.030 11/09/2023 1001   PHURINE 5.0 11/09/2023 1001   GLUCOSEU >=500 (A) 11/09/2023 1001   HGBUR SMALL (A) 11/09/2023 1001   BILIRUBINUR NEGATIVE 11/09/2023 1001   KETONESUR 20 (A) 11/09/2023 1001   PROTEINUR 30 (A) 11/09/2023 1001   NITRITE NEGATIVE 11/09/2023 1001   LEUKOCYTESUR NEGATIVE 11/09/2023 1001   Sepsis Labs Recent Labs  Lab 11/10/23 2121 11/11/23 0608 11/12/23 0436 11/15/23 0609  WBC 13.9* 13.0* 8.7 9.4   Microbiology Recent Results (from the past 240 hour(s))  Chlamydia/NGC rt PCR ([facility] only)     Status: None   Collection Time: 11/09/23 10:01 AM   Specimen: Urine; GU  Result Value Ref Range Status   Specimen source GC/Chlam URINE, RANDOM  Final   Chlamydia Tr NOT DETECTED NOT DETECTED Final   N gonorrhoeae NOT DETECTED NOT DETECTED Final    Comment: (NOTE) This CT/NG assay has not been evaluated in patients with a history of  hysterectomy. Performed at [facility], [address], [place], [place] [address]   Culture, blood (single)     Status:  None   Collection Time: 11/09/23  2:05 PM   Specimen: BLOOD RIGHT ARM  Result Value Ref Range Status   Specimen Description BLOOD RIGHT ARM  Final   Special Requests   Final    BOTTLES DRAWN AEROBIC AND ANAEROBIC Blood Culture adequate volume   Culture   Final    NO GROWTH 5 DAYS Performed at [facility], [address] [place], [place] [address]    Report Status 11/14/2023 FINAL  Final  Aerobic/Anaerobic Culture w Gram  Stain (surgical/deep wound)     Status: None   Collection Time: 11/09/23  5:07 PM   Specimen: Path Tissue  Result Value Ref Range Status   Specimen Description   Final    TISSUE Performed at [facility], [address] [place], [place] [address]    Special Requests SCROTAL TISSUE SPEC A  Final   Gram Stain   Final    RARE WBC PRESENT, PREDOMINANTLY MONONUCLEAR RARE GRAM POSITIVE COCCI IN PAIRS    Culture   Final    FEW ESCHERICHIA COLI FEW DIPHTHEROIDS(CORYNEBACTERIUM SPECIES) Standardized susceptibility testing for this organism is not available. MODERATE BACTEROIDES SPECIES NOT FRAGILIS BETA LACTAMASE POSITIVE Performed at [facility], [address] [place], [place] [address]    Report Status 11/14/2023 FINAL  Final   Organism ID, Bacteria ESCHERICHIA COLI  Final      Susceptibility   Escherichia coli - MIC*    AMPICILLIN <=2 SENSITIVE Sensitive     CEFEPIME <=0.12 SENSITIVE Sensitive     CEFTAZIDIME <=1 SENSITIVE Sensitive     CEFTRIAXONE <=0.25 SENSITIVE Sensitive     CIPROFLOXACIN <=0.25 SENSITIVE Sensitive     GENTAMICIN <=1 SENSITIVE Sensitive     IMIPENEM <=0.25 SENSITIVE Sensitive     TRIMETH/SULFA <=20 SENSITIVE Sensitive     AMPICILLIN/SULBACTAM <=2 SENSITIVE Sensitive     PIP/TAZO <=4 SENSITIVE Sensitive ug/mL    * FEW ESCHERICHIA COLI     Time coordinating discharge: Over 30 minutes  SIGNED:   Willeen Niece, MD  [facility] 11/15/2023, 5:51 PM Pager   If 7PM-7AM, please contact night-coverage

## 2023-11-15 NOTE — Progress Notes (Signed)
1600 discharged reviewed with patient and wound care also reviewed patient able to verbalize wound care instructions. Follow up appt discussed with patient to make for next week. Waiting on patients mother to review wound care with her. Patient dressed self after using urinal with no help.

## 2023-11-15 NOTE — Progress Notes (Signed)
0800 patient alert x4 able to make all needs known on room air foley in place no concerns at this time

## 2023-11-23 ENCOUNTER — Ambulatory Visit (INDEPENDENT_AMBULATORY_CARE_PROVIDER_SITE_OTHER): Payer: Self-pay | Admitting: Nurse Practitioner

## 2023-11-23 ENCOUNTER — Encounter: Payer: Self-pay | Admitting: Nurse Practitioner

## 2023-11-23 ENCOUNTER — Telehealth: Payer: Self-pay | Admitting: Nurse Practitioner

## 2023-11-23 VITALS — BP 148/100 | HR 82 | Temp 97.9°F | Ht 69.5 in | Wt 194.4 lb

## 2023-11-23 DIAGNOSIS — Z7984 Long term (current) use of oral hypoglycemic drugs: Secondary | ICD-10-CM

## 2023-11-23 DIAGNOSIS — I152 Hypertension secondary to endocrine disorders: Secondary | ICD-10-CM | POA: Insufficient documentation

## 2023-11-23 DIAGNOSIS — E1159 Type 2 diabetes mellitus with other circulatory complications: Secondary | ICD-10-CM

## 2023-11-23 DIAGNOSIS — N493 Fournier gangrene: Secondary | ICD-10-CM

## 2023-11-23 DIAGNOSIS — R21 Rash and other nonspecific skin eruption: Secondary | ICD-10-CM

## 2023-11-23 DIAGNOSIS — Z1159 Encounter for screening for other viral diseases: Secondary | ICD-10-CM

## 2023-11-23 DIAGNOSIS — E1165 Type 2 diabetes mellitus with hyperglycemia: Secondary | ICD-10-CM

## 2023-11-23 LAB — COMPREHENSIVE METABOLIC PANEL
ALT: 16 U/L (ref 0–53)
AST: 13 U/L (ref 0–37)
Albumin: 4 g/dL (ref 3.5–5.2)
Alkaline Phosphatase: 67 U/L (ref 39–117)
BUN: 12 mg/dL (ref 6–23)
CO2: 33 meq/L — ABNORMAL HIGH (ref 19–32)
Calcium: 9.6 mg/dL (ref 8.4–10.5)
Chloride: 98 meq/L (ref 96–112)
Creatinine, Ser: 0.83 mg/dL (ref 0.40–1.50)
GFR: 118.79 mL/min (ref >60.00)
Glucose, Bld: 271 mg/dL — ABNORMAL HIGH (ref 70–99)
Potassium: 4.4 meq/L (ref 3.5–5.1)
Sodium: 137 meq/L (ref 135–145)
Total Bilirubin: 0.4 mg/dL (ref 0.2–1.2)
Total Protein: 6.6 g/dL (ref 6.0–8.3)

## 2023-11-23 LAB — CBC
HCT: 37.5 % — ABNORMAL LOW (ref 39.0–52.0)
Hemoglobin: 12.5 g/dL — ABNORMAL LOW (ref 13.0–17.0)
MCHC: 33.4 g/dL (ref 30.0–36.0)
MCV: 86.6 fL (ref 78.0–100.0)
Platelets: 244 10*3/uL (ref 150.0–400.0)
RBC: 4.33 Mil/uL (ref 4.22–5.81)
RDW: 13.2 % (ref 11.5–15.5)
WBC: 6.3 10*3/uL (ref 4.0–10.5)

## 2023-11-23 LAB — TSH: TSH: 4.19 u[IU]/mL (ref 0.35–5.50)

## 2023-11-23 MED ORDER — BLOOD GLUCOSE MONITORING SUPPL DEVI: 1.0000 | Freq: Every day | 0 refills | Status: AC

## 2023-11-23 MED ORDER — LANCETS MISC. MISC: 1.0000 | Freq: Every day | 0 refills | Status: AC

## 2023-11-23 MED ORDER — METFORMIN HCL 500 MG PO TABS: ORAL_TABLET | ORAL | 0 refills | Status: DC

## 2023-11-23 MED ORDER — NYSTATIN 100000 UNIT/GM EX CREA: 1.0000 | TOPICAL_CREAM | Freq: Two times a day (BID) | CUTANEOUS | 0 refills | Status: DC

## 2023-11-23 MED ORDER — LANCET DEVICE MISC: 1.0000 | Freq: Every day | 0 refills | Status: AC

## 2023-11-23 MED ORDER — LISINOPRIL 10 MG PO TABS: 10.0000 mg | ORAL_TABLET | Freq: Every day | ORAL | 3 refills | Status: AC

## 2023-11-23 MED ORDER — DOXYCYCLINE HYCLATE 100 MG PO TABS: 100.0000 mg | ORAL_TABLET | Freq: Two times a day (BID) | ORAL | 0 refills | Status: AC

## 2023-11-23 MED ORDER — BLOOD GLUCOSE TEST VI STRP: 1.0000 | ORAL_STRIP | Freq: Every day | 0 refills | Status: AC

## 2023-11-23 NOTE — Patient Instructions (Addendum)
Nice to see you today I have sent in several medications to the pharmacy Hold off on the antibiotics until I get back in touch with you Follow up with me in 1 month, sooner if you need me   Metformin dosing is 1 tab (500mg ) every morning for 7 days, then 1 tab (500mg ) twice a day for 7 days then 2 tab (1000mg ) every morning and 1 tab (500mg ) every evening for 7 days,  then 2 tab (1000mg ) BID

## 2023-11-23 NOTE — Telephone Encounter (Signed)
Tammy Sours,  I saw Grant Curry in office. I see that you consulted with him inpatient and he was placed on augmentin and linezolid for outpatient use. He has developed a drug rash around the lower back. He has not taken the last two doses. Is there one that you would recommend taking if he can tolerate. He states that he has done amoxicillin in the past and tolerated it. He also had a rash in the groin area (pictures in the chart but I am treating that with nystatin). Thanks for your help  Margie Billet

## 2023-11-23 NOTE — Assessment & Plan Note (Signed)
Blood pressure uncontrolled.  Patient to be on lisinopril in the past.  Patient lisinopril 10 mg daily.  Follow-up 1 month

## 2023-11-23 NOTE — Telephone Encounter (Signed)
Can we tell the patient to continue taking the Augmentin. I will send in a few days of doxycycline to take instead of the linzolid

## 2023-11-23 NOTE — Progress Notes (Signed)
New Patient Office Visit  Subjective    Patient ID: Grant Curry, male    DOB: October 14, 1996  Age: 27 y.o. MRN: [id number]  CC:  Chief Complaint  Patient presents with   Establish Care    Pt complains of having reactions to antibiotic, pt is not sure which one is causing. Pt states of rashes on back that goes down to scrotum area. Pt was instructed to stop taking medication by home health nurse.     HPI Grant Curry presents to establish care   Grant Curry's gangrene: Patient went to the hospital on 11/09/2023 with reddened and swollen scrotum.  Patient was found to have shortness gangrene with sepsis.  Patient underwent scrotal exploration with surgical debridement on 12 5 and again on 12 4 at the direction of plastic surgery.  Patient was on Zosyn and Zyvox.  Patient is followed by ID and they recommended linezolid 600 mg twice daily and Augmentin 875 twice daily for 10 days patient supposed to follow-up with plastics in 1 week after discharge.  States that he did not take the last night or this mornings antibiotics. States that the home health nurse states to stop the medications. He has approx 2 days left.  States that he feels like he is doing ok. He does have some pressure and a stretching. States that he is doing the dressing change daily  BM daily   DM2: patient is currently maintained on insulin. None in the chart and last A1C was 10.6. states that he knew he was diabetic prior to the hospital. He has been on metformin and lisinopril in the past. Seeing endorince on 12/08/2023.  Dr Grant Curry  TDAP: unsure  Flu vaccine: refused  Pna: unsure    Outpatient Encounter Medications as of 11/23/2023  Medication Sig   Blood Glucose Monitoring Suppl DEVI 1 each by Does not apply route daily. May substitute to any manufacturer covered by patient's insurance.   Glucose Blood (BLOOD GLUCOSE TEST STRIPS) STRP 1 each by In Vitro route daily. May substitute to any  manufacturer covered by patient's insurance.   Lancet Device MISC 1 each by Does not apply route daily. May substitute to any manufacturer covered by patient's insurance.   Lancets Misc. MISC 1 each by Does not apply route daily. May substitute to any manufacturer covered by patient's insurance.   lisinopril (ZESTRIL) 10 MG tablet Take 1 tablet (10 mg total) by mouth daily.   metFORMIN (GLUCOPHAGE) 500 MG tablet 1 tab (500mg ) QAM for 7 days, then 1 tab (500mg ) BID for 7 days then 2 tab (1000mg ) QAM and 1 tab (500mg ) QPM then 2 tab (1000mg ) BID   nystatin cream (MYCOSTATIN) Apply 1 Application topically 2 (two) times daily.   amoxicillin-clavulanate (AUGMENTIN) 875-125 MG tablet Take 1 tablet by mouth every 12 (twelve) hours for 10 days. (Patient not taking: Reported on 11/23/2023)   linezolid (ZYVOX) 600 MG tablet Take 1 tablet (600 mg total) by mouth every 12 (twelve) hours for 10 days. (Patient not taking: Reported on 11/23/2023)   No facility-administered encounter medications on file as of 11/23/2023.    Past Medical History:  Diagnosis Date   Diabetes mellitus without complication (HCC)    Hypertension     Past Surgical History:  Procedure Laterality Date   INCISION AND DRAINAGE OF WOUND N/A 11/14/2023   Procedure: EXCISION AND DRAINAGE OF FORNIER'S GANGRENE OF SCROTUM AND CLOSURE;  Surgeon: Grant Form, DO;  Location: [facility] OR;  Service:  Plastics;  Laterality: N/A;   SCROTAL EXPLORATION Bilateral 11/09/2023   Procedure: SCROTUM AND PARTIAL PERINEAL EXPLORATION AND DEBRIDEMENT FOR FOURNIER'S GANGRENE;  Surgeon: Grant Scotland, MD;  Location: [facility] ORS;  Service: Urology;  Laterality: Bilateral;   SCROTAL EXPLORATION Bilateral 11/10/2023   Procedure: SCROTUM EXPLORATION;  Surgeon: Grant Scotland, MD;  Location: [facility] ORS;  Service: Urology;  Laterality: Bilateral;   tonsilectomy     WOUND DEBRIDEMENT  11/09/2023   Procedure: DEBRIDEMENT PERINEAL WOUND;  Surgeon: Grant Amabile,  DO;  Location: [facility] ORS;  Service: General;;    Family History  Problem Relation Age of Onset   Heart failure Father     Social History   Socioeconomic History   Marital status: Single    Spouse name: Not on file   Number of children: 0   Years of education: Not on file   Highest education level: Not on file  Occupational History   Not on file  Tobacco Use   Smoking status: Every Day    Current packs/day: 1.00    Average packs/day: 1 pack/day for 9.0 years (9.0 ttl pk-yrs)    Types: Cigarettes    Start date: 2016   Smokeless tobacco: Never  Vaping Use   Vaping status: Never Used  Substance and Sexual Activity   Alcohol use: Yes    Comment: occasional   Drug use: Yes    Frequency: 7.0 times per week    Types: Marijuana    Comment: uses daily   Sexual activity: Not on file  Other Topics Concern   Not on file  Social History Narrative   He started doing work with [employer]    Social Drivers of Health   Financial Resource Strain: Not on file  Food Insecurity: No Food Insecurity (11/12/2023)   Hunger Vital Sign    Worried About Running Out of Food in the Last Year: Never true    Ran Out of Food in the Last Year: Never true  Transportation Needs: No Transportation Needs (11/12/2023)   PRAPARE - Administrator, Civil Service (Medical): No    Lack of Transportation (Non-Medical): No  Physical Activity: Not on file  Stress: Not on file  Social Connections: Not on file  Intimate Partner Violence: Not At Risk (11/12/2023)   Humiliation, Afraid, Rape, and Kick questionnaire    Fear of Current or Ex-Partner: No    Emotionally Abused: No    Physically Abused: No    Sexually Abused: No    Review of Systems  Constitutional:  Negative for chills and fever.  Respiratory:  Negative for shortness of breath.   Cardiovascular:  Negative for chest pain.  Gastrointestinal:  Negative for abdominal pain, constipation, diarrhea, nausea and vomiting.  Neurological:   Negative for headaches.  Psychiatric/Behavioral:  Negative for hallucinations and suicidal ideas (passive SI).         Objective    BP (!) 148/100   Pulse 82   Temp 97.9 F (36.6 C) (Oral)   Ht 5' 9.5" (1.765 m)   Wt 194 lb 6.4 oz (88.2 kg)   SpO2 99%   BMI 28.30 kg/m   Physical Exam Vitals and nursing note reviewed. Exam conducted with a chaperone present Grant Curry, CMA).  Constitutional:      Appearance: Normal appearance.  HENT:     Right Ear: Tympanic membrane, ear canal and external ear normal.     Left Ear: Tympanic membrane, ear canal and external ear normal.  Mouth/Throat:     Mouth: Mucous membranes are moist.     Pharynx: Oropharynx is clear.  Eyes:     Extraocular Movements: Extraocular movements intact.     Pupils: Pupils are equal, round, and reactive to light.  Cardiovascular:     Rate and Rhythm: Normal rate and regular rhythm.     Pulses: Normal pulses.     Heart sounds: Normal heart sounds.  Pulmonary:     Effort: Pulmonary effort is normal.     Breath sounds: Normal breath sounds.  Musculoskeletal:     Right lower leg: No edema.     Left lower leg: No edema.  Neurological:     Mental Status: He is alert.              Assessment & Plan:   Problem List Items Addressed This Visit       Cardiovascular and Mediastinum   Hypertension associated with diabetes (HCC)   Blood pressure uncontrolled.  Patient to be on lisinopril in the past.  Patient lisinopril 10 mg daily.  Follow-up 1 month      Relevant Medications   metFORMIN (GLUCOPHAGE) 500 MG tablet   lisinopril (ZESTRIL) 10 MG tablet   Other Relevant Orders   TSH     Endocrine   Uncontrolled type 2 diabetes mellitus with hyperglycemia (HCC)   Used to be on medication in the past.  Will start patient on metformin titration to get to goal of 1000 mg twice daily.      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  Patient patient had reaction to antibiotics we will stop those currently did swab some discharge that was present.  He does have a follow-up with plastic surgery in approximately 9 days.  Patient also has home health that comes once a week per his report.      Relevant Medications   nystatin cream (MYCOSTATIN)   Other Relevant Orders   WOUND CULTURE   CBC   Comprehensive metabolic panel   Rash   Rash to bilateral inguinal area hide more of a yeast rash we will treat with nystatin cream twice daily for 7 days.  Patient also has a rash to the lower back that seems to be maybe drug eructation rash will reach out to ID see recommendations which antibiotic to continue.  Patient has tolerated amoxicillin products in the past wound culture sent      Relevant Medications   nystatin cream (MYCOSTATIN)   Other Visit Diagnoses       Encounter for hepatitis C screening test for low risk patient       Relevant Orders   Hepatitis C Antibody       Return in about 4 weeks (around 12/21/2023) for BP recheck, DM recheck.   Audria Nine, NP

## 2023-11-23 NOTE — Assessment & Plan Note (Signed)
Used to be on medication in the past.  Will start patient on metformin titration to get to goal of 1000 mg twice daily.

## 2023-11-23 NOTE — Telephone Encounter (Signed)
Contacted pt.  Advised to pt to continue Augmentin, start doxycyline instead of Linzolid per PCP.  Pt verbalized understanding and repeated instructions back to me.  No questions or concerns.

## 2023-11-23 NOTE — Assessment & Plan Note (Signed)
Wound is healing appropriately.  Patient patient had reaction to antibiotics we will stop those currently did swab some discharge that was present.  He does have a follow-up with plastic surgery in approximately 9 days.  Patient also has home health that comes once a week per his report.

## 2023-11-23 NOTE — Assessment & Plan Note (Signed)
Rash to bilateral inguinal area hide more of a yeast rash we will treat with nystatin cream twice daily for 7 days.  Patient also has a rash to the lower back that seems to be maybe drug eructation rash will reach out to ID see recommendations which antibiotic to continue.  Patient has tolerated amoxicillin products in the past wound culture sent

## 2023-11-24 ENCOUNTER — Encounter: Payer: Self-pay | Admitting: Nurse Practitioner

## 2023-11-24 NOTE — Anesthesia Postprocedure Evaluation (Signed)
Anesthesia Post Note  Patient: Grant Curry  Procedure(s) Performed: Sande Brothers AND PARTIAL PERINEAL EXPLORATION AND DEBRIDEMENT FOR FOURNIER'S GANGRENE (Bilateral: Scrotum) DEBRIDEMENT PERINEAL WOUND (Scrotum)  Patient location during evaluation: PACU Anesthesia Type: General Level of consciousness: awake and alert Pain management: pain level controlled Vital Signs Assessment: post-procedure vital signs reviewed and stable Respiratory status: spontaneous breathing, nonlabored ventilation, respiratory function stable and patient connected to nasal cannula oxygen Cardiovascular status: blood pressure returned to baseline and stable Postop Assessment: no apparent nausea or vomiting Anesthetic complications: no   No notable events documented.   Last Vitals:  Vitals:   11/10/23 1820 11/10/23 2002  BP: (!) 135/94 (!) 142/90  Pulse: 85 84  Resp: 18 19  Temp: 36.8 C 36.9 C  SpO2: 100% 100%    Last Pain:  Vitals:   11/10/23 2002  TempSrc: Oral  PainSc:                  Lenard Simmer

## 2023-11-25 NOTE — Telephone Encounter (Signed)
Do you want patient to call office for evaluation?

## 2023-11-26 LAB — HEPATITIS C ANTIBODY: Hepatitis C Ab: NONREACTIVE

## 2023-11-26 LAB — WOUND CULTURE
MICRO NUMBER:: 15866501
SPECIMEN QUALITY:: ADEQUATE

## 2023-11-29 ENCOUNTER — Other Ambulatory Visit: Payer: Self-pay | Admitting: Nurse Practitioner

## 2023-11-29 ENCOUNTER — Encounter: Payer: Self-pay | Admitting: Nurse Practitioner

## 2023-11-29 DIAGNOSIS — N493 Fournier gangrene: Secondary | ICD-10-CM

## 2023-11-29 MED ORDER — HYDROCODONE-ACETAMINOPHEN 5-325 MG PO TABS: 1.0000 | ORAL_TABLET | Freq: Three times a day (TID) | ORAL | 0 refills | Status: DC | PRN

## 2023-11-29 MED ORDER — CIPROFLOXACIN HCL 500 MG PO TABS: 500.0000 mg | ORAL_TABLET | Freq: Two times a day (BID) | ORAL | 0 refills | Status: AC

## 2023-12-01 ENCOUNTER — Ambulatory Visit (INDEPENDENT_AMBULATORY_CARE_PROVIDER_SITE_OTHER): Payer: Self-pay | Admitting: Nurse Practitioner

## 2023-12-01 ENCOUNTER — Encounter: Payer: Self-pay | Admitting: Nurse Practitioner

## 2023-12-01 VITALS — BP 138/88 | HR 80 | Temp 97.9°F | Ht 69.5 in | Wt 194.8 lb

## 2023-12-01 DIAGNOSIS — R21 Rash and other nonspecific skin eruption: Secondary | ICD-10-CM

## 2023-12-01 DIAGNOSIS — I152 Hypertension secondary to endocrine disorders: Secondary | ICD-10-CM

## 2023-12-01 DIAGNOSIS — Z7984 Long term (current) use of oral hypoglycemic drugs: Secondary | ICD-10-CM

## 2023-12-01 DIAGNOSIS — N493 Fournier gangrene: Secondary | ICD-10-CM

## 2023-12-01 DIAGNOSIS — E1159 Type 2 diabetes mellitus with other circulatory complications: Secondary | ICD-10-CM

## 2023-12-01 NOTE — Assessment & Plan Note (Signed)
Rash to lower back has resolved.  Inguinal area rash has improved some encourage patient continue nystatin cream.  Patient has not used any today

## 2023-12-01 NOTE — Patient Instructions (Addendum)
Nice to see you today Follow up with plastic surgery as scheduled tomorrow Continue using the cream on both sides of your groin Change the dressing every day Finish the antibiotics that I prescribed Keep your appointment as scheduled with me in January

## 2023-12-01 NOTE — Assessment & Plan Note (Signed)
Patient started back on lisinopril 10 mg.  Patient's blood pressure has improved continue lisinopril 10

## 2023-12-01 NOTE — Progress Notes (Signed)
Acute Office Visit  Subjective:     Patient ID: Grant Curry, male    DOB: Mar 14, 1996, 27 y.o.   MRN: [id number]  Chief Complaint  Patient presents with   Follow-up    Pt complains of lumps spreading in anal area. States he has some discomfort and some pain while sitting. Slight itchiness.     HPI Patient is in today for lumps/rash with a history of uncontrolled DM2,  fourneir's gangrene, HTN and tobacco abuse.  Patient was seen by me on 11/23/2023 for a hosptial follow up dealing with Founrier's gangrene. He had a rash to bilateral inguinal area and lower back. We stopped linzolid and continued amoxicilling. I reache dout ot ID and recommedned doxycycline instead of linzolid. I did a wound culture for discharge at that visit and then started patient on cipro due to sensitivity. He has a follw up on 12/02/2023 with plastic surgery  States that the bumps have been present for approx a couple weeks. State sthat he did mentioned it to the home health nurse States that it feels like air or liquid tryin gto escape. States when it gets to the end of it it starts hurting.  He is having  a BM every other day is not straining. Feels like there is a mass blocking. No blood.  Review of Systems  Constitutional:  Negative for chills and fever.  Respiratory:  Negative for shortness of breath.   Cardiovascular:  Negative for chest pain.  Gastrointestinal:  Positive for vomiting. Negative for abdominal pain, constipation, diarrhea and nausea.  Genitourinary:  Negative for dysuria and frequency.  Neurological:  Negative for headaches.        Objective:    BP 138/88   Pulse 80   Temp 97.9 F (36.6 C) (Oral)   Ht 5' 9.5" (1.765 m)   Wt 194 lb 12.8 oz (88.4 kg)   SpO2 99%   BMI 28.35 kg/m  BP Readings from Last 3 Encounters:  12/01/23 138/88  11/23/23 (!) 148/100  11/15/23 (!) 147/93   Wt Readings from Last 3 Encounters:  12/01/23 194 lb 12.8 oz (88.4 kg)  11/23/23 194 lb 6.4  oz (88.2 kg)  11/15/23 196 lb 6.9 oz (89.1 kg)   SpO2 Readings from Last 3 Encounters:  12/01/23 99%  11/23/23 99%  11/15/23 100%      Physical Exam Vitals and nursing note reviewed. Exam conducted with a chaperone present Melina Copa, CMA).  Constitutional:      Appearance: Normal appearance.  Cardiovascular:     Rate and Rhythm: Normal rate and regular rhythm.     Heart sounds: Normal heart sounds.  Pulmonary:     Effort: Pulmonary effort is normal.     Breath sounds: Normal breath sounds.  Genitourinary:    Penis: Normal.      Rectum: No mass or external hemorrhoid.         Comments: Rash to bilateral inguinal area (improving) Lymphadenopathy:     Lower Body: No right inguinal adenopathy. No left inguinal adenopathy.  Skin:    Comments: Rash to lower back has resolved   Neurological:     Mental Status: He is alert.     No results found for any visits on 12/01/23.      Assessment & Plan:   Problem List Items Addressed This Visit       Cardiovascular and Mediastinum   Hypertension associated with diabetes (HCC)   Patient started back on lisinopril 10 mg.  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  Sutures intact.  He does have a follow-up with plastic surgery on 12/02/2023.  Encouraged daily dressing changes along with gentle cleaning with soap and water.  Area of patient's concern is the end of the suture line for tissue is pulled together.  Noticed some discharge on the right inferior scrotum.  Patient recently had wound culture that showed positive for Pseudomonas and was placed on Ciprofloxacillin by me      Rash   Rash to lower back has resolved.  Inguinal area rash has improved some encourage patient continue nystatin cream.  Patient has not used any today       No orders of the defined types were placed  in this encounter.   Return if symptoms worsen or fail to improve, for As scheduled .  Audria Nine, NP

## 2023-12-01 NOTE — Assessment & Plan Note (Signed)
Patient's incision looks to be healing well.  Sutures intact.  He does have a follow-up with plastic surgery on 12/02/2023.  Encouraged daily dressing changes along with gentle cleaning with soap and water.  Area of patient's concern is the end of the suture line for tissue is pulled together.  Noticed some discharge on the right inferior scrotum.  Patient recently had wound culture that showed positive for Pseudomonas and was placed on Ciprofloxacillin by me

## 2023-12-02 ENCOUNTER — Encounter: Payer: Self-pay | Admitting: Plastic Surgery

## 2023-12-02 ENCOUNTER — Ambulatory Visit (INDEPENDENT_AMBULATORY_CARE_PROVIDER_SITE_OTHER): Payer: Self-pay | Admitting: Plastic Surgery

## 2023-12-02 VITALS — BP 159/95 | HR 84 | Ht 70.0 in | Wt 197.6 lb

## 2023-12-02 DIAGNOSIS — N493 Fournier gangrene: Secondary | ICD-10-CM

## 2023-12-02 MED ORDER — FLUCONAZOLE 150 MG PO TABS: 150.0000 mg | ORAL_TABLET | Freq: Once | ORAL | 0 refills | Status: AC

## 2023-12-02 NOTE — Progress Notes (Signed)
   Subjective:    Patient ID: Grant Curry, male    DOB: [birth year], 27 y.o.   MRN: [id number]  The patient is a 27 year old male here for follow-up after being treated for Fournier's gangrene.  He was admitted to the hospital in [place] and then transferred to [facility].  We did partial closure on December 9.  He now has a little bit of a fungal infection but the closure actually looks really good.  It has not completely healed over but it is headed in the right direction.  It does not appear to be inflamed or infected the worst part is just the periwound area with yeast infection.      Review of Systems  Constitutional: Negative.   HENT: Negative.    Eyes: Negative.   Respiratory: Negative.    Cardiovascular: Negative.   Gastrointestinal: Negative.   Genitourinary: Negative.        Objective:   Physical Exam Vitals reviewed.  Constitutional:      Appearance: Normal appearance.  Cardiovascular:     Rate and Rhythm: Normal rate.     Pulses: Normal pulses.  Skin:    General: Skin is warm.     Capillary Refill: Capillary refill takes less than 2 seconds.     Coloration: Skin is not jaundiced.  Neurological:     Mental Status: He is alert and oriented to person, place, and time.  Psychiatric:        Mood and Affect: Mood normal.        Behavior: Behavior normal.        Thought Content: Thought content normal.        Judgment: Judgment normal.        Assessment & Plan:     ICD-10-CM   1. 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  Their PCP already gave them a fungal cream to put on the area.  Patient is aware not to have any alcohol while taking the Diflucan.

## 2023-12-08 ENCOUNTER — Encounter: Payer: Self-pay | Admitting: Nurse Practitioner

## 2023-12-21 ENCOUNTER — Ambulatory Visit: Payer: Medicaid Other | Admitting: Nurse Practitioner

## 2023-12-21 VITALS — BP 160/100 | HR 82 | Temp 97.8°F | Ht 70.0 in | Wt 192.2 lb

## 2023-12-21 DIAGNOSIS — N493 Fournier gangrene: Secondary | ICD-10-CM

## 2023-12-21 DIAGNOSIS — I152 Hypertension secondary to endocrine disorders: Secondary | ICD-10-CM | POA: Diagnosis not present

## 2023-12-21 DIAGNOSIS — E1165 Type 2 diabetes mellitus with hyperglycemia: Secondary | ICD-10-CM | POA: Diagnosis not present

## 2023-12-21 DIAGNOSIS — E1159 Type 2 diabetes mellitus with other circulatory complications: Secondary | ICD-10-CM | POA: Diagnosis not present

## 2023-12-21 LAB — POCT GLYCOSYLATED HEMOGLOBIN (HGB A1C): Hemoglobin A1C: 9.8 % — AB (ref 4.0–5.6)

## 2023-12-21 MED ORDER — AMLODIPINE BESYLATE 5 MG PO TABS: 5.0000 mg | ORAL_TABLET | Freq: Every day | ORAL | 0 refills | Status: DC

## 2023-12-21 NOTE — Assessment & Plan Note (Signed)
 Patient was seen and evaluated by plastic surgery he does have a follow-up appointment with them on 12/23/2023 encourage patient to keep appointment

## 2023-12-21 NOTE — Assessment & Plan Note (Signed)
 Patient was prescribed metformin  last month.  With the anticipated goal of getting up to 1000 mg twice daily.  Patient states that he is only been taking 1 tablet daily.  As he just try to get back "on his feet.  Encourage patient to try to do the ramp-up.  Take medication as prescribed.  He has not been taking his glucose did not pick up the glucose monitoring supplies at the pharmacy.  It was referral to endocrinology at patient's request today

## 2023-12-21 NOTE — Assessment & Plan Note (Signed)
 Patient currently maintained on lisinopril  10 mg.  Patient has been taking medication as prescribed but blood pressure not well-controlled.  Will add on amlodipine  5 mg daily.  He will follow-up in 6 weeks for blood pressure recheck

## 2023-12-21 NOTE — Progress Notes (Signed)
 Established Patient Office Visit  Subjective   Patient ID: Grant Curry, male    DOB: [birth year]  Age: 28 y.o. MRN: [id number]  Chief Complaint  Patient presents with   Diabetes   Hypertension      DM2: patient was uncontrolled. Last A1C in the hospital was 10.6. patinet was on metfroin in the past. He was started back on metfromin with the goal of getting up to metfromin 1000mg  BID. He is here for a recheck. States that he plans to start the metformin . States that he is doing one metformin  a day. States that he has not been checking his pharmacy    HTN: history of the same. He was suppose to be on lisinopril  10mg  daily. He was started on this last office visit and here for a recheck. States that he has been doing it every day. Has not been checking the blood pressure   States that his mental helath has been suffering. States that he has been out of work for [employer]. States that he feels like that when he is on the path he gets knocked off the path. States that he thinks "great what's next". Waiting for the other shoe to drop. States that he has always had trouble sleeping. States that he can go to sleep but not always stay asleep      Review of Systems  Constitutional:  Negative for chills and fever.  Respiratory:  Negative for shortness of breath.   Cardiovascular:  Negative for chest pain.  Gastrointestinal:  Positive for diarrhea. Negative for abdominal pain, nausea and vomiting.  Psychiatric/Behavioral:  Negative for hallucinations and suicidal ideas.       Objective:     BP (!) 160/100   Pulse 82   Temp 97.8 F (36.6 C) (Oral)   Ht 5\' 10"  (1.778 m)   Wt 192 lb 3.2 oz (87.2 kg)   SpO2 98%   BMI 27.58 kg/m  BP Readings from Last 3 Encounters:  12/21/23 (!) 160/100  12/02/23 (!) 159/95  12/01/23 138/88   Wt Readings from Last 3 Encounters:  12/21/23 192 lb 3.2 oz (87.2 kg)  12/02/23 197 lb 9.6 oz (89.6 kg)  12/01/23 194 lb 12.8 oz (88.4 kg)   SpO2  Readings from Last 3 Encounters:  12/21/23 98%  12/02/23 98%  12/01/23 99%      Physical Exam Vitals and nursing note reviewed.  Constitutional:      Appearance: Normal appearance.  Cardiovascular:     Rate and Rhythm: Normal rate and regular rhythm.     Heart sounds: Normal heart sounds.  Pulmonary:     Effort: Pulmonary effort is normal.     Breath sounds: Normal breath sounds.  Neurological:     Mental Status: He is alert.      Results for orders placed or performed in visit on 12/21/23  POCT glycosylated hemoglobin (Hb A1C)  Result Value Ref Range   Hemoglobin A1C 9.8 (A) 4.0 - 5.6 %   HbA1c POC (<> result, manual entry)     HbA1c, POC (prediabetic range)     HbA1c, POC (controlled diabetic range)        The ASCVD Risk score (Arnett DK, et al., 2019) failed to calculate for the following reasons:   The 2019 ASCVD risk score is only valid for ages 65 to 55    Assessment & Plan:   Problem List Items Addressed This Visit       Cardiovascular and Mediastinum  Hypertension associated with diabetes (HCC)   Patient currently maintained on lisinopril  10 mg.  Patient has been taking medication as prescribed but blood pressure not well-controlled.  Will add on amlodipine  5 mg daily.  He will follow-up in 6 weeks for blood pressure recheck      Relevant Medications   amLODipine  (NORVASC ) 5 MG tablet     Endocrine   Uncontrolled type 2 diabetes mellitus with hyperglycemia (HCC) - Primary   Patient was prescribed metformin  last month.  With the anticipated goal of getting up to 1000 mg twice daily.  Patient states that he is only been taking 1 tablet daily.  As he just try to get back "on his feet.  Encourage patient to try to do the ramp-up.  Take medication as prescribed.  He has not been taking his glucose did not pick up the glucose monitoring supplies at the pharmacy.  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    Margarie Shay, NP

## 2023-12-21 NOTE — Patient Instructions (Signed)
 Nice to see you today I have added on a blood pressure pill Try and ramp up on the metformin    You can do 1 tablet in the morning for a week, then 1 tablet in the morning and then 1 tablet in the evening for a week, then 2 tablets in the morning and 1 tablet in the evening for a week, then 2 tablets in the morning and 2 tablets in the evening

## 2023-12-23 ENCOUNTER — Ambulatory Visit (INDEPENDENT_AMBULATORY_CARE_PROVIDER_SITE_OTHER): Payer: Medicaid Other | Admitting: Surgical

## 2023-12-23 VITALS — BP 159/92 | HR 87

## 2023-12-23 DIAGNOSIS — N493 Fournier gangrene: Secondary | ICD-10-CM

## 2023-12-23 NOTE — Progress Notes (Signed)
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  He underwent excision of Fournier's gangrene, partial layer closure of 15 cm wound with Dr. Ulice Bold on 11/14/2023.  He was last seen here in the office 3 weeks ago, was having some issues with fungal infections in the groin, he was started on Diflucan and also had a fungal cream from his PCP.  This has significantly improved since that appointment.  He reports today that he is overall doing well, he reports that he has noticed some mild bloody drainage from the perianal wound.  He reports that he has been applying Vashe to the area daily.  He has not been keeping the area covered with gauze.  He is not having any infectious symptoms.  He does have questions about if he can return to work on light duty.  Chaperone present on exam On exam scrotal wound/incision is mostly intact, he does have a small opening at the superior most portion that is approximately 8 x 2 mm.  He also has an opening on the distalmost portion of the incision that is approximately 1 x 2 mm.  There is no surrounding erythema or cellulitic changes noted.  No scrotal swelling is noted.  No scrotal tenderness is noted.  No fungal infection or rashes noted within the inguinal creases.  He also has a wound of the perineum that is approximately 1.5 x 4 mm.  There is no active drainage or surrounding erythema.  Granulation tissue is noted at the base of the wound bed.  A/P: Discussed with patient the importance of continuing with cleaning the area with Vashe daily.  Also discussed the importance of keeping the area covered with Vaseline and gauze daily.  Discussed with patient that he could return to work if you would like, recommend starting with light duty and avoiding strenuous movements.  Also recommend starting with shorter shifts.  Discussed the importance of avoiding increased moisture/sweating in the perineal area to decrease risk of infection. Patient  was agreeable with plan, will plan to see him back in 2 to 3 weeks for reevaluation.  Recommend calling with questions or concerns.  Pictures were obtained of the patient and placed in the chart with the patient's or guardian's permission.

## 2024-01-03 ENCOUNTER — Telehealth: Payer: Self-pay

## 2024-01-03 NOTE — Telephone Encounter (Signed)
error

## 2024-01-03 NOTE — Telephone Encounter (Addendum)
Faxed signed order from Dr. Ulice Bold to [facility] good from 11/16/2023-01/14/2024  Received Fax Confirmation:  This message was sent via FAXCOM, a product from [employer]. [email or web address].[email or web address]/                    -------Fax Transmission Report-------  To:               Recipient at [phone] Subject:          Signed orders Grant Curry for [facility] Result:           The transmission was successful. Explanation:      All Pages Ok Pages Sent:       7 Connect Time:     3 minutes, 2 seconds Transmit Time:    01/03/2024 15:04 Transfer Rate:    14400 Status Code:      0000 Retry Count:      0 Job Id:           [id number] Unique Id:        [id number] Fax Line:         18 Fax Server:       [employer]

## 2024-01-06 ENCOUNTER — Encounter: Payer: Self-pay | Admitting: Surgical

## 2024-01-06 ENCOUNTER — Ambulatory Visit (INDEPENDENT_AMBULATORY_CARE_PROVIDER_SITE_OTHER): Payer: Medicaid Other | Admitting: Surgical

## 2024-01-06 VITALS — BP 149/95 | HR 91

## 2024-01-06 DIAGNOSIS — N493 Fournier gangrene: Secondary | ICD-10-CM

## 2024-01-06 NOTE — Progress Notes (Signed)
Patient is a 28 year old male here for follow-up on his Fournier's gangrene.  He had Fournier's gangrene, underwent debridement with urology, subsequently underwent layered closure with Dr. Ulice Bold on 11/14/2023.  He is 7 weeks postop.  He is doing well, he does report he has noticed some minor pain in the inferior scrotal area.  He reports that his pain has been 1-2 on a scale of 10.  He reports he has been back to work and feels as if this may have contributed to some discomfort from the gauze being irritating due to increased movement.  He is not having any infectious symptoms or any other concerns today.  Chaperone present on exam On exam left scrotal incision is intact and very well-healed.  There is no surrounding erythema or cellulitic changes.  There is no tenderness noted with palpation.  He does continue to have an opening of the perineum that is approximately 6 x 2 mm.  There is no surrounding erythema or cellulitic changes.  Granulation tissue is noted within the wound bed.  Wound is clean  A/P:  Recommend continue with Vaseline and gauze to the perineal wound.  We discussed that pain is not abnormal at this point and I do not see any signs of infection or concern on exam.  We discussed that he can continue to work if he feels comfortable, but if he does have days where he is noticing some increased tenderness or pain then he can certainly take off work.  We provided him with a letter today.  I would like to see him back in 2 to 3 weeks.  He can work on an as tolerated basis until then.  Pictures were obtained of the patient and placed in the chart with the patient's or guardian's permission.

## 2024-01-12 ENCOUNTER — Encounter: Payer: Self-pay | Admitting: *Deleted

## 2024-01-18 ENCOUNTER — Ambulatory Visit: Payer: Self-pay | Admitting: Dietician

## 2024-01-26 ENCOUNTER — Encounter: Payer: Self-pay | Admitting: Surgical

## 2024-01-26 ENCOUNTER — Ambulatory Visit: Payer: Medicaid Other | Admitting: Surgical

## 2024-01-26 VITALS — BP 168/105 | HR 101

## 2024-01-26 DIAGNOSIS — N493 Fournier gangrene: Secondary | ICD-10-CM

## 2024-01-26 NOTE — Progress Notes (Signed)
 Patient is a 28 year old male here for follow-up on his perineal wound.  He had Fournier's gangrene, underwent debridement with urology with subsequent layered closure by Dr. Ulice Bold on 11/14/2023.  He is just shy of 12 weeks postop.  He reports he is overall doing well.  He is having some ongoing sharp shooting pains along the incision of the scrotum.  He reports he has been keeping the area clean.  He is not having any infectious symptoms.  Chaperone present on exam On exam left scrotal incision is intact, very well-healed.  No erythema or cellulitic changes noted of the scrotum.  No firmness noted., nontender.  He does have a small opening at the perineum that is approximately 2 x 2 mm.  No surrounding erythema or cellulitic changes.  Granulation tissue noted within the wound bed.  A/P:  Patient is overall doing well, there is no signs infection or concern on exam.  Discussed with patient to continue to apply Vaseline 1-2 times daily, cover with gauze if able.  He is fine to return to work, discussed no restrictions other than long strides such as running.  Discussed the importance of this due to decreasing risk of wound separation.  Patient's blood pressure is elevated today, he denied any vision changes, headaches or any other symptomatic changes per RN.  Recommend following up in 3 to 4 weeks for reevaluation.  Recommend calling with questions or concerns.

## 2024-02-02 ENCOUNTER — Ambulatory Visit (INDEPENDENT_AMBULATORY_CARE_PROVIDER_SITE_OTHER): Payer: Medicaid Other | Admitting: Nurse Practitioner

## 2024-02-02 VITALS — BP 136/82 | HR 81 | Temp 98.2°F | Ht 70.0 in | Wt 195.6 lb

## 2024-02-02 DIAGNOSIS — I152 Hypertension secondary to endocrine disorders: Secondary | ICD-10-CM | POA: Diagnosis not present

## 2024-02-02 DIAGNOSIS — Z7984 Long term (current) use of oral hypoglycemic drugs: Secondary | ICD-10-CM | POA: Diagnosis not present

## 2024-02-02 DIAGNOSIS — E1159 Type 2 diabetes mellitus with other circulatory complications: Secondary | ICD-10-CM | POA: Diagnosis not present

## 2024-02-02 DIAGNOSIS — E1165 Type 2 diabetes mellitus with hyperglycemia: Secondary | ICD-10-CM | POA: Diagnosis not present

## 2024-02-02 MED ORDER — METFORMIN HCL 500 MG PO TABS
1000.0000 mg | ORAL_TABLET | Freq: Two times a day (BID) | ORAL | 1 refills | Status: AC
Start: 2024-02-02 — End: ?

## 2024-02-02 MED ORDER — AMLODIPINE BESYLATE 10 MG PO TABS: 10.0000 mg | ORAL_TABLET | Freq: Every day | ORAL | 1 refills | Status: AC

## 2024-02-02 NOTE — Assessment & Plan Note (Signed)
 Patient states he is out of metformin and request refill.  Will refill medication out of courtesy for patient he needs to get additional refills through endocrinology

## 2024-02-02 NOTE — Patient Instructions (Signed)
 Nice to see you today I have increased the amlodipine from 5mg  to 10mg  daily. You can take 2 tablets of the 5mg  at the same time until you finish what you have at home. The new prescription is a 10mg  tablet and you will take just 1 tablet daily. Follow up with me in 3 months for a blood pressure check  Check blood pressure at home 1-2 times a week and write it down for me

## 2024-02-02 NOTE — Assessment & Plan Note (Signed)
 Patient maintained on lisinopril 10 mg daily.  Will increase amlodipine from 5 mg to 10 mg daily.  Patient check blood pressure 1-3 times a week at home.

## 2024-02-02 NOTE — Progress Notes (Signed)
 Established Patient Office Visit  Subjective   Patient ID: Grant Curry, male    DOB: June 12, 1996  Age: 28 y.o. MRN: [id number]  Chief Complaint  Patient presents with   Hypertension    Follow up for BP. Pt states he has been doing okay. Pt states that he is in the process of getting an at home BP cuff from endo office.        HTN: patient was seen by me for a hosptial follow up. He was last seen by me on 12/21/2023. He was on lisinopril 10mg  and was still uncontrolled. We did add on amlodipine 5mg  daily and is here for a recheck. Toleratinng medication. States that he does have blood pressure cuff at home but does not check his blood pressure.  DM2: he has been established with Dr Alphia Kava He is seen and then on 02/15/2024. He is awaiting a CGM.  Patient is currently maintained on metformin 1000 mg twice daily    Review of Systems  Constitutional:  Negative for chills and fever.  Respiratory:  Negative for shortness of breath.   Cardiovascular:  Negative for chest pain.  Neurological:  Negative for dizziness and headaches.      Objective:     BP 136/82   Pulse 81   Temp 98.2 F (36.8 C) (Oral)   Ht 5\' 10"  (1.778 m)   Wt 195 lb 9.6 oz (88.7 kg)   SpO2 98%   BMI 28.07 kg/m  BP Readings from Last 3 Encounters:  02/02/24 136/82  01/26/24 (!) 168/105  01/06/24 (!) 149/95   Wt Readings from Last 3 Encounters:  02/02/24 195 lb 9.6 oz (88.7 kg)  12/21/23 192 lb 3.2 oz (87.2 kg)  12/02/23 197 lb 9.6 oz (89.6 kg)   SpO2 Readings from Last 3 Encounters:  02/02/24 98%  01/26/24 99%  01/06/24 99%      Physical Exam Vitals and nursing note reviewed.  Constitutional:      Appearance: Normal appearance.  Cardiovascular:     Rate and Rhythm: Normal rate and regular rhythm.     Heart sounds: Normal heart sounds.  Pulmonary:     Effort: Pulmonary effort is normal.     Breath sounds: Normal breath sounds.  Musculoskeletal:     Right lower leg: No edema.      Left lower leg: No edema.  Neurological:     Mental Status: He is alert.      No results found for any visits on 02/02/24.    The ASCVD Risk score (Arnett DK, et al., 2019) failed to calculate for the following reasons:   The 2019 ASCVD risk score is only valid for ages 9 to 44    Assessment & Plan:   Problem List Items Addressed This Visit       Cardiovascular and Mediastinum   Hypertension associated with diabetes (HCC) - Primary   Patient maintained on lisinopril 10 mg daily.  Will increase amlodipine from 5 mg to 10 mg daily.  Patient check blood pressure 1-3 times a week at home.      Relevant Medications   amLODipine (NORVASC) 10 MG tablet   metFORMIN (GLUCOPHAGE) 500 MG tablet     Endocrine   Uncontrolled type 2 diabetes mellitus with hyperglycemia [facility])   Patient states he is out of metformin and request refill.  Will refill medication out of courtesy for patient he needs to get additional refills through endocrinology      Relevant Medications  metFORMIN (GLUCOPHAGE) 500 MG tablet    Return in about 3 months (around 05/01/2024) for BP recheck.    Audria Nine, NP

## 2024-02-24 ENCOUNTER — Encounter: Payer: Self-pay | Admitting: Surgical

## 2024-02-24 ENCOUNTER — Ambulatory Visit: Payer: Medicaid Other | Admitting: Surgical

## 2024-02-24 VITALS — BP 135/87 | HR 86 | Ht 70.0 in | Wt 196.0 lb

## 2024-02-24 DIAGNOSIS — N5089 Other specified disorders of the male genital organs: Secondary | ICD-10-CM

## 2024-02-24 DIAGNOSIS — N493 Fournier gangrene: Secondary | ICD-10-CM

## 2024-02-24 NOTE — Progress Notes (Signed)
 Patient is a 28 year old male here for follow-up on his perineal wound.  He had Fournier's gangrene, underwent debridement with urology and subsequent layered closure with Dr. Ulice Bold on 11/14/2023.  He is 3-1/2 months postop.  He is doing really well, he reports the perineal wound has completely healed.  He does continue to have ongoing sharp occasional shooting pains along the incision of the scrotum.  He is otherwise doing well.  Does not have any specific concerns.  Chaperone present on exam On exam left scrotal incision is intact, very well-healed.  There is no erythema or cellulitic changes noted of the scrotum or perineum.  Perineal wound has completely epithelialized and closed.   A/P:  No further management required.  Discussed with patient importance of keeping the area clean and maintaining good blood sugar control.  Recommend following up as needed, calling with questions or concerns.

## 2024-05-01 ENCOUNTER — Ambulatory Visit: Payer: Medicaid Other | Admitting: Nurse Practitioner

## 2024-11-30 ENCOUNTER — Telehealth: Admitting: Family Medicine

## 2024-11-30 DIAGNOSIS — R112 Nausea with vomiting, unspecified: Secondary | ICD-10-CM

## 2024-11-30 MED ORDER — ONDANSETRON HCL 4 MG PO TABS: 4.0000 mg | ORAL_TABLET | Freq: Three times a day (TID) | ORAL | 0 refills | Status: AC | PRN

## 2024-11-30 NOTE — Progress Notes (Signed)

## 2024-11-30 NOTE — Progress Notes (Signed)
 Pt needs a work note.
